# Patient Record
Sex: Male | Born: 1985 | Race: White | Hispanic: No | Marital: Single | Smoking: Former smoker
Health system: Southern US, Community
[De-identification: ages and names within clinical notes are randomized; demographics above are authoritative.]

## PROBLEM LIST (undated history)

## (undated) DIAGNOSIS — F329 Major depressive disorder, single episode, unspecified: Secondary | ICD-10-CM

## (undated) DIAGNOSIS — I499 Cardiac arrhythmia, unspecified: Secondary | ICD-10-CM

## (undated) DIAGNOSIS — Z8659 Personal history of other mental and behavioral disorders: Secondary | ICD-10-CM

## (undated) DIAGNOSIS — F32A Depression, unspecified: Secondary | ICD-10-CM

## (undated) DIAGNOSIS — I480 Paroxysmal atrial fibrillation: Principal | ICD-10-CM

## (undated) DIAGNOSIS — S42001A Fracture of unspecified part of right clavicle, initial encounter for closed fracture: Secondary | ICD-10-CM

## (undated) DIAGNOSIS — F419 Anxiety disorder, unspecified: Secondary | ICD-10-CM

## (undated) HISTORY — DX: Fracture of unspecified part of right clavicle, initial encounter for closed fracture: S42.001A

## (undated) HISTORY — PX: HAND SURGERY: SHX662

## (undated) HISTORY — DX: Paroxysmal atrial fibrillation: I48.0

## (undated) HISTORY — DX: Personal history of other mental and behavioral disorders: Z86.59

---

## 2015-08-04 ENCOUNTER — Other Ambulatory Visit: Payer: No Typology Code available for payment source

## 2015-08-04 DIAGNOSIS — B182 Chronic viral hepatitis C: Secondary | ICD-10-CM

## 2015-08-04 LAB — CBC WITH DIFFERENTIAL/PLATELET
Basophils Absolute: 0.1 10*3/uL (ref 0.0–0.1)
Basophils Relative: 1 % (ref 0–1)
Eosinophils Absolute: 0.2 10*3/uL (ref 0.0–0.7)
Eosinophils Relative: 3 % (ref 0–5)
HEMATOCRIT: 48.2 % (ref 39.0–52.0)
HEMOGLOBIN: 17.1 g/dL — AB (ref 13.0–17.0)
LYMPHS ABS: 2.2 10*3/uL (ref 0.7–4.0)
LYMPHS PCT: 34 % (ref 12–46)
MCH: 31.7 pg (ref 26.0–34.0)
MCHC: 35.5 g/dL (ref 30.0–36.0)
MCV: 89.4 fL (ref 78.0–100.0)
MONOS PCT: 9 % (ref 3–12)
MPV: 9.4 fL (ref 8.6–12.4)
Monocytes Absolute: 0.6 10*3/uL (ref 0.1–1.0)
NEUTROS ABS: 3.5 10*3/uL (ref 1.7–7.7)
NEUTROS PCT: 53 % (ref 43–77)
Platelets: 270 10*3/uL (ref 150–400)
RBC: 5.39 MIL/uL (ref 4.22–5.81)
RDW: 14.2 % (ref 11.5–15.5)
WBC: 6.6 10*3/uL (ref 4.0–10.5)

## 2015-08-04 LAB — HEPATITIS B SURFACE ANTIBODY, QUANTITATIVE: HEPATITIS B-POST: 0 m[IU]/mL

## 2015-08-04 LAB — HEPATITIS A ANTIBODY, TOTAL: Hep A Total Ab: NONREACTIVE

## 2015-08-04 LAB — IRON: IRON: 192 ug/dL — AB (ref 50–180)

## 2015-08-04 LAB — HEPATITIS B CORE ANTIBODY, TOTAL: Hep B Core Total Ab: NONREACTIVE

## 2015-08-04 LAB — HEPATITIS B SURFACE ANTIGEN: HEP B S AG: NEGATIVE

## 2015-08-05 LAB — PROTIME-INR
INR: 0.97 (ref <1.50)
Prothrombin Time: 13 seconds (ref 11.6–15.2)

## 2015-08-05 LAB — ANA: Anti Nuclear Antibody(ANA): NEGATIVE

## 2015-09-07 ENCOUNTER — Encounter: Payer: Self-pay | Admitting: Internal Medicine

## 2015-09-07 ENCOUNTER — Ambulatory Visit (INDEPENDENT_AMBULATORY_CARE_PROVIDER_SITE_OTHER): Payer: No Typology Code available for payment source | Admitting: Internal Medicine

## 2015-09-07 VITALS — BP 108/73 | HR 68 | Temp 98.0°F | Ht 67.0 in | Wt 186.0 lb

## 2015-09-07 DIAGNOSIS — Z23 Encounter for immunization: Secondary | ICD-10-CM

## 2015-09-07 DIAGNOSIS — B182 Chronic viral hepatitis C: Secondary | ICD-10-CM | POA: Insufficient documentation

## 2015-09-07 MED ORDER — SOFOSBUVIR-VELPATASVIR 400-100 MG PO TABS
1.0000 | ORAL_TABLET | Freq: Every day | ORAL | Status: DC
Start: 2015-09-07 — End: 2015-09-27

## 2015-09-07 MED ORDER — SOFOSBUVIR-VELPATASVIR 400-100 MG PO TABS: 1.0000 | ORAL_TABLET | Freq: Every day | ORAL | Status: DC

## 2015-09-07 NOTE — Progress Notes (Signed)
[facility]   CC: consideration for treatment for chronic hepatitis C  HPI:  +Paul Clarke is a 29 y.o. male who presents for initial evaluation and management of chronic hepatitis C.  Patient tested positive this year. Hepatitis C-associated risk factors present are: IV drug abuse (details: about 10 years ago). Patient denies sexual contact with person with liver disease, tattoos. Patient has had other studies performed. Results: hepatitis C RNA by PCR, result: positive. Patient has not had prior treatment for Hepatitis C. Patient does not have a past history of liver disease. Patient does not have a family history of liver disease. Patient does not  have associated signs or symptoms related to liver disease.  Labs reviewed and confirm chronic hepatitis C with a positive viral load.   Records reviewed from PCP.  No acid reflux medicaitons.        Patient does not have documented immunity to Hepatitis A. Patient does not have documented immunity to Hepatitis B.    Review of Systems:   Constitutional: negative for fatigue and malaise Gastrointestinal: negative for diarrhea Musculoskeletal: negative for myalgias and arthralgias All other systems reviewed and are negative      No past medical history on file.  Prior to Admission medications   Medication Sig Start Date End Date Taking? Authorizing Provider  buPROPion (WELLBUTRIN) 75 MG tablet Take 75 mg by mouth daily.   Yes Historical Provider, MD  prazosin (MINIPRESS) 2 MG capsule Take 2 mg by mouth at bedtime.   Yes Historical Provider, MD  QUEtiapine (SEROQUEL) 50 MG tablet Take 50 mg by mouth daily as needed.   Yes Historical Provider, MD  sertraline (ZOLOFT) 50 MG tablet Take 50 mg by mouth daily.   Yes Historical Provider, MD  traZODone (DESYREL) 50 MG tablet Take 50 mg by mouth at bedtime.   Yes Historical Provider, MD  Sofosbuvir-Velpatasvir (EPCLUSA) 400-100 MG TABS Take 1 tablet by mouth daily.  09/07/15   Gardiner Barefootobert W Comer, MD    No Known Allergies  Social History  Substance Use Topics  . Smoking status: Current Every Day Smoker    Types: Cigars    Start date: 10/09/1974  . Smokeless tobacco: Never Used  . Alcohol Use: 0.0 oz/week    0 Standard drinks or equivalent per week     Comment: occasional    FMHx: no liver cancer, no cirrhosis   Objective:  Constitutional: in no apparent distress and alert,  Filed Vitals:   09/07/15 1114  BP: 108/73  Pulse: 68  Temp: 98 F (36.7 C)   Eyes: anicteric Cardiovascular: Cor RRR and No murmurs Respiratory: CTA B; normal respiratory effort Gastrointestinal: Bowel sounds are normal, liver is not enlarged, spleen is not enlarged Musculoskeletal: peripheral pulses normal, no pedal edema, no clubbing or cyanosis Skin: negative for - jaundice, spider hemangioma, telangiectasia, palmar erythema, ecchymosis and atrophy; no porphyria cutanea tarda Lymphatic: no cervical lymphadenopathy   Laboratory Genotype: No results found for: HCVGENOTYPE HCV viral load: No results found for: HCVQUANT Lab Results  Component Value Date   WBC 6.6 08/04/2015   HGB 17.1* 08/04/2015   HCT 48.2 08/04/2015   MCV 89.4 08/04/2015   PLT 270 08/04/2015   No results found for: CREATININE, BUN, NA, K, CL, CO2 No results found for: ALT, AST, GGT, ALKPHOS   Labs and history reviewed and show CHILD-PUGH A  5-6 points: Child class A 7-9 points: Child class B 10-15 points: Child class C  Lab Results  Component Value Date   INR 0.97 08/04/2015     Assessment: New Patient with Chronic Hepatitis C genotype 3, untreated.  I discussed with the patient the lab findings that confirm chronic hepatitis C as well as the natural history and progression of disease including about 30% of people who develop cirrhosis of the liver if left untreated and once cirrhosis is established there is a 2-7% risk per year of liver cancer and liver failure.  I discussed the  importance of treatment and benefits in reducing the risk, even if significant liver fibrosis exists.   Plan: 1) Patient counseled extensively on limiting acetaminophen to no more than 2 grams daily, avoidance of alcohol. 2) Transmission discussed with patient including sexual transmission, sharing razors and toothbrush.   3) Will need referral to gastroenterology if concern for cirrhosis 4) Will need referral for substance abuse counseling: No.; Further work up to include urine drug screen  No. 5) Will prescribe Epclusa for 12 weeks 6) Hepatitis A vaccine Yes.   7) Hepatitis B vaccine Yes.   8) Pneumovax vaccine if concern for cirrhosis 9) Further work up to include liver staging with elastography 10) will follow up after elastography

## 2015-09-07 NOTE — Patient Instructions (Signed)
Date 09/07/2015  Dear [name], As discussed in the ID Clinic, your hepatitis C therapy will include the following medications:          Epclusa (sofosbuvir 400 mg/velpatasvir 100 mg) tablet:           Take 1 tablet by mouth once daily   Please note that ALL MEDICATIONS WILL START ON THE SAME DATE for a total of 12 weeks. ---------------------------------------------------------------- Your HCV Treatment Start Date: TBA   Your HCV genotype:  3    Liver Fibrosis: TBD    ---------------------------------------------------------------- YOUR PHARMACY CONTACT:   [facility] [address] Phone: [phone] Hours: Monday to Friday 7:30 am to 6:00 pm   Please always contact your pharmacy at least 3-4 business days before you run out of medications to ensure your next month's medication is ready or 1 week prior to running out if you receive it by mail.  Remember, each prescription is for 28 days. ---------------------------------------------------------------- GENERAL NOTES REGARDING YOUR HEPATITIS C MEDICATION:  SOFOSBUVIR/VELPATASVIR (Epclusa): - can be taken with our without food  - Acid reducing agents such as H2 blockers (ie. Pepcid (famotidine), Zantac (ranitidine), Tagamet (cimetidine), Axid (nizatidine) and proton pump inhibitors (ie. Prilosec (omeprazole), Protonix (pantoprazole), Nexium (esomeprazole), or Aciphex (rabeprazole)) can decrease effectiveness of Epclusa. Do not take until you have discussed with a health care provider.    -Antacids that contain magnesium and/or aluminum hydroxide (ie. Milk of Magensia, Rolaids, Gaviscon, Maalox, Mylanta, an dArthritis Pain Formula)can reduce absorption of Epclusa, so take them at least 4 hours after Epclusa.  -Calcium carbonate (calcium supplements or antacids such as Tums, Caltrate, Os-Cal)needs to be taken at least 4 hours hours after Epclusa.  -St. John's  wort or any products that contain St. John's wort like some herbal supplements  Please inform the office prior to starting any of these medications.  - The common side effects associated with Epclusa include:      1. Fatigue      2. Headache      3. Nausea      4. Diarrhea      5. Insomnia  Please note that this only lists the most common side effects and is NOT a comprehensive list of the potential side effects of these medications. For more information, please review the drug information sheets that come with your medication package from the pharmacy.  ---------------------------------------------------------------- GENERAL HELPFUL HINTS ON HCV THERAPY: 1. Stay well-hydrated. 2. Notify the ID Clinic of any changes in your other over-the-counter/herbal or prescription medications. 3. If you miss a dose of your medication, take the missed dose as soon as you remember. Return to your regular time/dose schedule the next day.  4.  Do not stop taking your medications without first talking with your healthcare provider. 5.  You may take Tylenol (acetaminophen), as long as the dose is less than 2000 mg (OR no more than 4 tablets of the Tylenol Extra Strengths 500mg  tablet) in 24 hours. 6.  You will see our pharmacist-specialist within the first 2 weeks of starting your medication. 7.  You will need to obtain routine labs around week 4 and12 weeks after starting and then 3 to 6 months after finishing Epclusa.    Staci RighterOMER, Kunta, MD  [facility] [address] [place], [place]  [id number] [phone]

## 2015-09-07 NOTE — Addendum Note (Signed)
Addended by: Gardiner BarefootOMER, Thanos W on: 09/07/2015 02:46 PM   Modules accepted: Orders

## 2015-09-13 ENCOUNTER — Encounter: Payer: Self-pay | Admitting: Pharmacy Technician

## 2015-09-28 ENCOUNTER — Ambulatory Visit
Payer: No Typology Code available for payment source | Admitting: Pharmacist Clinician (PhC)/ Clinical Pharmacy Specialist

## 2015-09-28 DIAGNOSIS — B182 Chronic viral hepatitis C: Secondary | ICD-10-CM

## 2015-09-28 NOTE — Progress Notes (Signed)
Patient ID: Paul Clarke Toenjes, male   DOB: 12/14/1985, 29 y.o.   MRN: [id number] HPI: Paul Clarke Tirone is a 29 y.o. male who is here for his 2 weeks f/u of hep c.  No results found for: HCVGENOTYPE, HEPCGENOTYPE  Allergies: No Known Allergies  Vitals:    Past Medical History: No past medical history on file.  Social History: Social History   Social History  . Marital Status: Unknown    Spouse Name: N/A  . Number of Children: N/A  . Years of Education: N/A   Social History Main Topics  . Smoking status: Current Every Day Smoker    Types: Cigars    Start date: 10/09/1974  . Smokeless tobacco: Never Used  . Alcohol Use: 0.0 oz/week    0 Standard drinks or equivalent per week     Comment: occasional  . Drug Use: Yes    Special: Marijuana  . Sexual Activity: Not on file   Other Topics Concern  . Not on file   Social History Narrative  . No narrative on file    Labs: HEPATITIS B SURFACE AG (no units)  Date Value  08/04/2015 NEGATIVE    No results found for: HCVGENOTYPE, HEPCGENOTYPE  No flowsheet data found.  INR (no units)  Date Value  08/04/2015 0.97    CrCl: CrCl cannot be calculated (Unknown ideal weight.).  Fibrosis Score: ARFI 12/21   Child-Pugh Score: Class A  Previous Treatment Regimen: Naive  Assessment: Molly MaduroRobert started his Epclusa on 12/5 for the genotype 3 hep C. He only had some mild side effects from it. He has not missed any doses. 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 He is scheduled to get his elastography tomorrow. Due to the holiday schedule, we are going to delay lab until the appt with Dr. Luciana Axeomer on 10/14/15.   Recommendations:  Cont Epclusa 1 PO qday Lab at the next appt with Dr. Zettie Cooleyomer  Pham, Appleton[place]h Quang, [place].D., BCPS, AAHIVP Clinical Infectious Disease Pharmacist [facility] 09/28/2015, 4:28 PM

## 2015-09-29 ENCOUNTER — Ambulatory Visit (HOSPITAL_COMMUNITY)
Admission: RE | Admit: 2015-09-29 | Discharge: 2015-09-29 | Disposition: A | Discharge status: Home / Self Care | Payer: Self-pay | Source: Ambulatory Visit | Attending: Internal Medicine | Admitting: Internal Medicine

## 2015-09-29 DIAGNOSIS — B182 Chronic viral hepatitis C: Secondary | ICD-10-CM | POA: Insufficient documentation

## 2015-10-14 ENCOUNTER — Encounter: Payer: Self-pay | Admitting: Internal Medicine

## 2015-10-14 ENCOUNTER — Ambulatory Visit (INDEPENDENT_AMBULATORY_CARE_PROVIDER_SITE_OTHER): Payer: No Typology Code available for payment source | Admitting: Internal Medicine

## 2015-10-14 VITALS — BP 141/87 | HR 94 | Temp 98.1°F | Wt 183.0 lb

## 2015-10-14 DIAGNOSIS — B182 Chronic viral hepatitis C: Secondary | ICD-10-CM

## 2015-10-14 DIAGNOSIS — K7400 Hepatic fibrosis, unspecified: Secondary | ICD-10-CM | POA: Insufficient documentation

## 2015-10-14 DIAGNOSIS — Z23 Encounter for immunization: Secondary | ICD-10-CM

## 2015-10-14 NOTE — Progress Notes (Signed)
   Subjective:    Patient ID: Paul Clarke, male    DOB: 03/30/1986, 30 y.o.   MRN: [id number]  HPI Here for follow up of HCV.    Genotype 3, viral load from outside PCP, elastography with F2/3.  Started Epclusa 4 weeks ago and tolerating well.  No weight loss, no rash. Some fatigue more than usual.     Review of Systems  Constitutional: Positive for fatigue.  Gastrointestinal: Negative for diarrhea.  Neurological: Negative for dizziness and headaches.       Objective:   Physical Exam  Constitutional: He appears well-developed and well-nourished. No distress.  Eyes: No scleral icterus.  Cardiovascular: Normal rate, regular rhythm and normal heart sounds.   No murmur heard. Pulmonary/Chest: Effort normal and breath sounds normal. No respiratory distress.  Skin: No rash noted.          Assessment & Plan:

## 2015-10-14 NOTE — Assessment & Plan Note (Signed)
Discussed results, limit alcohol.  Repeat in 1 year.

## 2015-10-14 NOTE — Assessment & Plan Note (Signed)
Will continue with Epclusa, labs today and rtc 2 months for end of treatment unless concerns.

## 2015-10-19 LAB — HEPATITIS C RNA QUANTITATIVE: HCV Quantitative: NOT DETECTED IU/mL (ref <15)

## 2015-11-04 ENCOUNTER — Telehealth: Payer: Self-pay | Admitting: Pharmacy Technician

## 2015-11-12 ENCOUNTER — Encounter: Payer: Self-pay | Admitting: Pharmacy Technician

## 2015-11-15 NOTE — Telephone Encounter (Signed)
Left message for the patient to call the office asap very important his medication is here for him to pick up.

## 2015-12-16 ENCOUNTER — Ambulatory Visit: Payer: No Typology Code available for payment source | Admitting: Internal Medicine

## 2015-12-17 ENCOUNTER — Telehealth: Payer: Self-pay | Admitting: *Deleted

## 2015-12-17 NOTE — Telephone Encounter (Signed)
Patient came by clinic to pick up his 3rd month of medication. Was a little leary of giving him the medication as we have been trying to get him since end of January. He advised he got his first month and 2 weeks later got his 2nd shipment. He has only missed 2 days as of today and no other. He works out of town and was able to get here today. Relayed the information to Dr Luciana Axeomer and he advise based on the information to give the patient his last month, schedule him for labs in 6 weeks and to see him in 8 weeks. The patient was given medication and appts scheduled and he advised understands follow up and importance of finishing medications. Advised will let pharmacy know about interaction.

## 2016-01-31 ENCOUNTER — Other Ambulatory Visit: Payer: No Typology Code available for payment source

## 2016-02-14 ENCOUNTER — Ambulatory Visit: Payer: No Typology Code available for payment source | Admitting: Internal Medicine

## 2016-07-09 DIAGNOSIS — S42001A Fracture of unspecified part of right clavicle, initial encounter for closed fracture: Secondary | ICD-10-CM

## 2016-07-09 DIAGNOSIS — I480 Paroxysmal atrial fibrillation: Secondary | ICD-10-CM

## 2016-07-09 HISTORY — DX: Fracture of unspecified part of right clavicle, initial encounter for closed fracture: S42.001A

## 2016-07-09 HISTORY — DX: Paroxysmal atrial fibrillation: I48.0

## 2016-07-17 ENCOUNTER — Emergency Department (HOSPITAL_COMMUNITY): Payer: Worker's Compensation

## 2016-07-17 ENCOUNTER — Other Ambulatory Visit: Payer: Self-pay

## 2016-07-17 ENCOUNTER — Inpatient Hospital Stay (HOSPITAL_COMMUNITY)
Admission: EM | Admit: 2016-07-17 | Discharge: 2016-07-19 | DRG: 923 | Disposition: A | Discharge status: Home / Self Care | Payer: Worker's Compensation | Attending: Orthopedic Surgery | Admitting: Orthopedic Surgery

## 2016-07-17 ENCOUNTER — Encounter (HOSPITAL_COMMUNITY): Payer: Self-pay | Admitting: *Deleted

## 2016-07-17 DIAGNOSIS — T23031A Burn of unspecified degree of multiple right fingers (nail), not including thumb, initial encounter: Secondary | ICD-10-CM | POA: Diagnosis present

## 2016-07-17 DIAGNOSIS — S060X1A Concussion with loss of consciousness of 30 minutes or less, initial encounter: Secondary | ICD-10-CM | POA: Diagnosis present

## 2016-07-17 DIAGNOSIS — Z823 Family history of stroke: Secondary | ICD-10-CM | POA: Diagnosis not present

## 2016-07-17 DIAGNOSIS — W11XXXA Fall on and from ladder, initial encounter: Secondary | ICD-10-CM | POA: Diagnosis present

## 2016-07-17 DIAGNOSIS — I4891 Unspecified atrial fibrillation: Secondary | ICD-10-CM | POA: Diagnosis present

## 2016-07-17 DIAGNOSIS — S060X9A Concussion with loss of consciousness of unspecified duration, initial encounter: Secondary | ICD-10-CM | POA: Diagnosis present

## 2016-07-17 DIAGNOSIS — Z23 Encounter for immunization: Secondary | ICD-10-CM

## 2016-07-17 DIAGNOSIS — W868XXA Exposure to other electric current, initial encounter: Secondary | ICD-10-CM | POA: Diagnosis present

## 2016-07-17 DIAGNOSIS — R413 Other amnesia: Secondary | ICD-10-CM | POA: Diagnosis present

## 2016-07-17 DIAGNOSIS — S2231XA Fracture of one rib, right side, initial encounter for closed fracture: Secondary | ICD-10-CM | POA: Diagnosis present

## 2016-07-17 DIAGNOSIS — S22009A Unspecified fracture of unspecified thoracic vertebra, initial encounter for closed fracture: Secondary | ICD-10-CM

## 2016-07-17 DIAGNOSIS — Y9222 Religious institution as the place of occurrence of the external cause: Secondary | ICD-10-CM | POA: Diagnosis not present

## 2016-07-17 DIAGNOSIS — R55 Syncope and collapse: Secondary | ICD-10-CM | POA: Diagnosis not present

## 2016-07-17 DIAGNOSIS — Y939 Activity, unspecified: Secondary | ICD-10-CM | POA: Diagnosis not present

## 2016-07-17 DIAGNOSIS — Z885 Allergy status to narcotic agent status: Secondary | ICD-10-CM | POA: Diagnosis not present

## 2016-07-17 DIAGNOSIS — S42001A Fracture of unspecified part of right clavicle, initial encounter for closed fracture: Secondary | ICD-10-CM | POA: Diagnosis present

## 2016-07-17 DIAGNOSIS — S22089A Unspecified fracture of T11-T12 vertebra, initial encounter for closed fracture: Secondary | ICD-10-CM | POA: Diagnosis present

## 2016-07-17 DIAGNOSIS — T754XXA Electrocution, initial encounter: Secondary | ICD-10-CM | POA: Diagnosis present

## 2016-07-17 DIAGNOSIS — I480 Paroxysmal atrial fibrillation: Secondary | ICD-10-CM | POA: Diagnosis not present

## 2016-07-17 DIAGNOSIS — S42034A Nondisplaced fracture of lateral end of right clavicle, initial encounter for closed fracture: Secondary | ICD-10-CM | POA: Diagnosis present

## 2016-07-17 DIAGNOSIS — Y990 Civilian activity done for income or pay: Secondary | ICD-10-CM | POA: Diagnosis not present

## 2016-07-17 DIAGNOSIS — S060XAA Concussion with loss of consciousness status unknown, initial encounter: Secondary | ICD-10-CM | POA: Diagnosis present

## 2016-07-17 DIAGNOSIS — Z8249 Family history of ischemic heart disease and other diseases of the circulatory system: Secondary | ICD-10-CM

## 2016-07-17 DIAGNOSIS — F17290 Nicotine dependence, other tobacco product, uncomplicated: Secondary | ICD-10-CM | POA: Diagnosis present

## 2016-07-17 LAB — CBC WITH DIFFERENTIAL/PLATELET
Basophils Absolute: 0.1 10*3/uL (ref 0.0–0.1)
Basophils Relative: 1 %
Eosinophils Absolute: 0.2 10*3/uL (ref 0.0–0.7)
Eosinophils Relative: 3 %
HEMATOCRIT: 52 % (ref 39.0–52.0)
HEMOGLOBIN: 18.2 g/dL — AB (ref 13.0–17.0)
LYMPHS ABS: 3 10*3/uL (ref 0.7–4.0)
LYMPHS PCT: 39 %
MCH: 33 pg (ref 26.0–34.0)
MCHC: 35 g/dL (ref 30.0–36.0)
MCV: 94.4 fL (ref 78.0–100.0)
MONO ABS: 0.7 10*3/uL (ref 0.1–1.0)
MONOS PCT: 9 %
NEUTROS ABS: 3.8 10*3/uL (ref 1.7–7.7)
NEUTROS PCT: 48 %
Platelets: 234 10*3/uL (ref 150–400)
RBC: 5.51 MIL/uL (ref 4.22–5.81)
RDW: 14.1 % (ref 11.5–15.5)
WBC: 7.7 10*3/uL (ref 4.0–10.5)

## 2016-07-17 LAB — URINALYSIS, ROUTINE W REFLEX MICROSCOPIC
BILIRUBIN URINE: NEGATIVE
Glucose, UA: NEGATIVE mg/dL
Hgb urine dipstick: NEGATIVE
Ketones, ur: NEGATIVE mg/dL
LEUKOCYTES UA: NEGATIVE
Nitrite: NEGATIVE
Protein, ur: NEGATIVE mg/dL
SPECIFIC GRAVITY, URINE: 1.013 (ref 1.005–1.030)
pH: 7.5 (ref 5.0–8.0)

## 2016-07-17 LAB — COMPREHENSIVE METABOLIC PANEL
ALK PHOS: 59 U/L (ref 38–126)
ALT: 22 U/L (ref 17–63)
ANION GAP: 10 (ref 5–15)
AST: 30 U/L (ref 15–41)
Albumin: 3.9 g/dL (ref 3.5–5.0)
BILIRUBIN TOTAL: 0.7 mg/dL (ref 0.3–1.2)
BUN: 7 mg/dL (ref 6–20)
CALCIUM: 8.9 mg/dL (ref 8.9–10.3)
CO2: 23 mmol/L (ref 22–32)
Chloride: 104 mmol/L (ref 101–111)
Creatinine, Ser: 0.9 mg/dL (ref 0.61–1.24)
GFR calc Af Amer: >60 mL/min (ref >60)
GLUCOSE: 99 mg/dL (ref 65–99)
POTASSIUM: 3.8 mmol/L (ref 3.5–5.1)
Sodium: 137 mmol/L (ref 135–145)
TOTAL PROTEIN: 6.5 g/dL (ref 6.5–8.1)

## 2016-07-17 LAB — I-STAT TROPONIN, ED
TROPONIN I, POC: 0 ng/mL (ref 0.00–0.08)
Troponin i, poc: 0 ng/mL (ref 0.00–0.08)

## 2016-07-17 LAB — I-STAT CG4 LACTIC ACID, ED
LACTIC ACID, VENOUS: 1.75 mmol/L (ref 0.5–1.9)
LACTIC ACID, VENOUS: 1.86 mmol/L (ref 0.5–1.9)

## 2016-07-17 LAB — CK: Total CK: 71 U/L (ref 49–397)

## 2016-07-17 MED ORDER — HYDROMORPHONE HCL 1 MG/ML IJ SOLN
1.0000 mg | Freq: Once | INTRAMUSCULAR | Status: AC
  Administered 2016-07-17: 1 mg via INTRAVENOUS
  Filled 2016-07-17: qty 1

## 2016-07-17 MED ORDER — POTASSIUM CHLORIDE IN NACL 20-0.45 MEQ/L-% IV SOLN
INTRAVENOUS | Status: DC
  Administered 2016-07-17: 22:00:00 via INTRAVENOUS
  Filled 2016-07-17: qty 1000

## 2016-07-17 MED ORDER — DOCUSATE SODIUM 100 MG PO CAPS
100.0000 mg | ORAL_CAPSULE | Freq: Two times a day (BID) | ORAL | Status: DC
  Administered 2016-07-17 – 2016-07-19 (×4): 100 mg via ORAL
  Filled 2016-07-17 (×4): qty 1

## 2016-07-17 MED ORDER — ONDANSETRON HCL 4 MG PO TABS: 4.0000 mg | ORAL_TABLET | Freq: Four times a day (QID) | ORAL | Status: DC | PRN

## 2016-07-17 MED ORDER — IOPAMIDOL (ISOVUE-300) INJECTION 61%
INTRAVENOUS | Status: AC
  Administered 2016-07-17: 100 mL
  Filled 2016-07-17: qty 100

## 2016-07-17 MED ORDER — SODIUM CHLORIDE 0.9 % IV BOLUS (SEPSIS)
1000.0000 mL | Freq: Once | INTRAVENOUS | Status: AC
  Administered 2016-07-17: 1000 mL via INTRAVENOUS

## 2016-07-17 MED ORDER — ENOXAPARIN SODIUM 40 MG/0.4ML ~~LOC~~ SOLN
40.0000 mg | SUBCUTANEOUS | Status: DC
  Administered 2016-07-18: 40 mg via SUBCUTANEOUS
  Filled 2016-07-17 (×2): qty 0.4

## 2016-07-17 MED ORDER — DILTIAZEM LOAD VIA INFUSION
20.0000 mg | Freq: Once | INTRAVENOUS | Status: AC
  Administered 2016-07-17: 20 mg via INTRAVENOUS
  Filled 2016-07-17: qty 20

## 2016-07-17 MED ORDER — HYDROMORPHONE HCL 1 MG/ML IJ SOLN
0.5000 mg | INTRAMUSCULAR | Status: DC | PRN
  Administered 2016-07-17 – 2016-07-18 (×6): 1 mg via INTRAVENOUS
  Filled 2016-07-17 (×6): qty 1

## 2016-07-17 MED ORDER — PANTOPRAZOLE SODIUM 40 MG PO TBEC
40.0000 mg | DELAYED_RELEASE_TABLET | Freq: Every day | ORAL | Status: DC
  Administered 2016-07-17: 40 mg via ORAL
  Filled 2016-07-17: qty 1

## 2016-07-17 MED ORDER — ONDANSETRON HCL 4 MG/2ML IJ SOLN
4.0000 mg | Freq: Four times a day (QID) | INTRAMUSCULAR | Status: DC | PRN
  Administered 2016-07-17 – 2016-07-18 (×2): 4 mg via INTRAVENOUS
  Filled 2016-07-17 (×2): qty 2

## 2016-07-17 MED ORDER — DILTIAZEM HCL-DEXTROSE 100-5 MG/100ML-% IV SOLN (PREMIX)
5.0000 mg/h | INTRAVENOUS | Status: DC
  Administered 2016-07-17: 5 mg/h via INTRAVENOUS
  Administered 2016-07-18: 10 mg/h via INTRAVENOUS
  Filled 2016-07-17 (×2): qty 100

## 2016-07-17 NOTE — ED Notes (Signed)
Patient states he was working on some lights in [place] was up on a 848ft. Ladder was electrocuted with 110 volt and fell backward off the ladder landing on his right side. Co- workers state he was [employer]. 30 sec. Up arrival a/o c/o right clavicle pain. Ice pack applied.

## 2016-07-17 NOTE — ED Notes (Signed)
Returned from xray

## 2016-07-17 NOTE — ED Provider Notes (Signed)
[facility]-EMERGENCY DEPT Provider Note   CSN: [id number] Arrival date & time: 07/17/16  1110     History   Chief Complaint No chief complaint on file.   HPI Paul Clarke is a 30 y.o. male.  The history is provided by the patient and the EMS personnel.  Trauma Mechanism of injury: fall 8 feet from ladder after electrical shock from wires and fall Injury location: shoulder/arm Injury location detail: R shoulder Incident location: at work Time since incident: 30 minutes Arrived directly from scene: yes   Fall:      Fall occurred: from a ladder      Height of fall: 8 feet      Impact surface: concrete      Point of impact: right shoulder and right sided chest.  EMS/PTA data:      Bystander interventions: none      Blood loss: none      Responsiveness: alert      Oriented to: person, place, situation and time      Loss of consciousness: yes (x 20 seconds per witness)      Amnesic to event: yes      Airway interventions: none      Breathing interventions: none      Medications administered: fentanyl (150 mcg PTA)  Current symptoms:      Associated symptoms:            Reports loss of consciousness (x 20 seconds per witness).            Denies abdominal pain, back pain, chest pain, headache, nausea and neck pain.    History reviewed. No pertinent past medical history.  There are no active problems to display for this patient.   History reviewed. No pertinent surgical history.     Home Medications    Prior to Admission medications   Not on File    Family History History reviewed. No pertinent family history.  Social History Social History  Substance Use Topics  . Smoking status: Current Some Day Smoker  . Smokeless tobacco: Never Used  . Alcohol use Yes     Allergies   Morphine and related   Review of Systems Review of Systems  HENT: Negative for facial swelling.   Respiratory: Negative for shortness of breath.   Cardiovascular: Negative for  chest pain.  Gastrointestinal: Negative for abdominal pain and nausea.  Genitourinary: Negative for flank pain.  Musculoskeletal: Negative for back pain and neck pain.  Skin: Negative for wound.  Neurological: Positive for loss of consciousness (x 20 seconds per witness). Negative for headaches.  Hematological: Does not bruise/bleed easily.  Psychiatric/Behavioral: Negative for confusion.     Physical Exam Updated Vital Signs BP 144/95   Pulse 78   Temp 98.2 F (36.8 C) (Oral)   Resp 14   SpO2 91%   Physical Exam  Constitutional: He is oriented to person, place, and time. He appears well-developed and well-nourished. No distress.  Pleasant, cooperative, non-toxic appearing  HENT:  Head: Normocephalic and atraumatic.  Mouth/Throat: Oropharynx is clear and moist.  No mouth/dental injuries  Eyes: Conjunctivae and EOM are normal. Pupils are equal, round, and reactive to light. No scleral icterus.  Neck: Normal range of motion. Neck supple. No tracheal deviation present.  No c-spine TTP  Cardiovascular: Intact distal pulses.   2+ radial and Dp's b/l. Irregularly irregular rhythm with rate 140's  Pulmonary/Chest: Effort normal and breath sounds normal. No respiratory distress. He exhibits no tenderness.  Abdominal:  Soft. He exhibits no distension. There is no tenderness.  No abdominal or flank ecchymoses or hematomas  Musculoskeletal: He exhibits tenderness. He exhibits no edema or deformity.  Right shoulder nonfocal TTP. Full active ROM of right elbow and wrist, no other arm tenderness. Intact sensation and strength of hand. No spinal TTP. No other extremity TTP. Abrasion over right elbow  Neurological: He is alert and oriented to person, place, and time. No cranial nerve deficit. He exhibits normal muscle tone. Coordination normal.  Intact strength and sensation throughout  Skin: Skin is warm and dry. Capillary refill takes less than 2 seconds. No rash noted. He is not diaphoretic.  No pallor.  Psychiatric: He has a normal mood and affect.  Nursing note and vitals reviewed.    ED Treatments / Results  Labs (all labs ordered are listed, but only abnormal results are displayed) Labs Reviewed  CBC WITH DIFFERENTIAL/PLATELET - Abnormal; Notable for the following:       Result Value   Hemoglobin 18.2 (*)    All other components within normal limits  COMPREHENSIVE METABOLIC PANEL  URINALYSIS, ROUTINE W REFLEX MICROSCOPIC (NOT AT [facility])  I-STAT CG4 LACTIC ACID, ED  Rosezena Sensor, ED  I-STAT CG4 LACTIC ACID, ED    EKG  EKG Interpretation None       Radiology Dg Thoracic Spine 2 View  Result Date: 07/17/2016 CLINICAL DATA:  Fall with back pain.  Initial encounter. EXAM: THORACIC SPINE 2 VIEWS COMPARISON:  None. FINDINGS: Mild height loss along the T11 left-sided vertebral body with mild cortical irregularity also seen in the lateral projection. No subluxation. No degenerative changes. IMPRESSION: Probable T11 body fracture with mild height loss. Electronically Signed   By: Marnee Spring M.D.   On: 07/17/2016 13:04   Dg Lumbar Spine Complete  Result Date: 07/17/2016 CLINICAL DATA:  Level 2 trauma. Fall from ladder. Back pain. Initial encounter. EXAM: LUMBAR SPINE - COMPLETE 4+ VIEW COMPARISON:  None. FINDINGS: Nondisplaced right twelfth rib fracture. No evidence of lumbar spine fracture or subluxation. IMPRESSION: Nondisplaced right twelfth rib fracture. Negative lumbar spine. Electronically Signed   By: Marnee Spring M.D.   On: 07/17/2016 13:02   Dg Shoulder Right  Result Date: 07/17/2016 CLINICAL DATA:  30 year old male post trauma (electrocuted and thrown from ladder). Initial encounter. EXAM: RIGHT SHOULDER - 2+ VIEW COMPARISON:  None. FINDINGS: No fracture or dislocation. The minimal indentation of the lateral right humeral head is felt to represent a normal finding rather than result of fracture or impaction injury. No abnormality seen in this region on  accompanying right humerus films. IMPRESSION: No fracture or dislocation.  Please see above. Electronically Signed   By: Lacy Duverney M.D.   On: 07/17/2016 13:05   Ct Head Wo Contrast  Result Date: 07/17/2016 CLINICAL DATA:  Electrocution with fall from ladder EXAM: CT HEAD WITHOUT CONTRAST CT CERVICAL SPINE WITHOUT CONTRAST TECHNIQUE: Multidetector CT imaging of the head and cervical spine was performed following the standard protocol without intravenous contrast. Multiplanar CT image reconstructions of the cervical spine were also generated. COMPARISON:  None. FINDINGS: CT HEAD FINDINGS Brain: The ventricles are normal in size and configuration. There is no intracranial mass hemorrhage, extra-axial fluid collection, or midline shift. Gray-white compartments are normal. There is no evident acute infarct. Vascular: No hyperdense vessel. Middle cerebral arteries show equivalent attenuation bilaterally. There are no foci of arterial vascular calcification. Skull: Bony calvarium appears intact. Sinuses/Orbits: There is mucosal thickening in ethmoid air cells bilaterally anteriorly,  full left normal height. There is mild mucosal thickening in the posterior right maxillary antrum. Other paranasal sinuses clear. Orbits appear symmetric bilaterally. Other: Mastoid air cells are clear. CT CERVICAL SPINE FINDINGS Alignment: There is no spondylolisthesis. Skull base and vertebrae: Skull base and craniocervical junction regions appear normal. There is no evident fracture. No blastic or lytic bone lesions. Soft tissues and spinal canal: Prevertebral soft tissues and predental space regions are normal. No spinal stenosis. No paraspinous lesions evident. Disc levels: There is slight disc space narrowing at C6-7. Other disc spaces appear normal. No nerve root edema or effacement. No disc extrusion or stenosis. Upper chest: No abnormality noted. Other: None IMPRESSION: CT head: No intracranial mass, hemorrhage, or extra-axial  fluid collection. Gray-white compartments appear normal. No fractures evident. There are areas of paranasal sinus disease. CT cervical spine: No fracture or spondylolisthesis. Slight disc space narrowing C6-7. No prevertebral effaced. Disc extrusion or stenosis. Electronically Signed   By: Bretta BangWilliam  Woodruff III M.D.   On: 07/17/2016 12:35   Ct Chest W Contrast  Result Date: 07/17/2016 CLINICAL DATA:  Level 2 trauma. Electrocution with fall. Suggestion of the T11 vertebral body fracture on thoracic spine radiographs from earlier today. EXAM: CT CHEST WITH CONTRAST TECHNIQUE: Multidetector CT imaging of the chest was performed during intravenous contrast administration. CONTRAST:  100mL ISOVUE-300 IOPAMIDOL (ISOVUE-300) INJECTION 61% COMPARISON:  07/17/2016 thoracic spine radiographs. FINDINGS: Cardiovascular: Normal heart size. No significant pericardial fluid/thickening. Great vessels are normal in course and caliber. No evidence of acute thoracic aortic injury. No central pulmonary emboli. Mediastinum/Nodes: No pneumomediastinum. No mediastinal hematoma. No discrete thyroid nodules. Unremarkable esophagus. No axillary, mediastinal or hilar lymphadenopathy. Lungs/Pleura: No pneumothorax. No pleural effusion. Minimal paraseptal emphysema at the lung apices. No acute consolidative airspace disease, lung masses or significant pulmonary nodules. No pneumatoceles. Upper abdomen: Unremarkable. Musculoskeletal: No aggressive appearing focal osseous lesions. There is a comminuted medial right clavicle fracture, which appears to involve the anterior superior margin of the right sternoclavicular joint, with mild overriding of the fracture fragments and 1 cm anterior displacement of the dominant lateral fracture fragment. There is associated intramuscular hematoma in the lower right sternocleidomastoid. There is a mild compression fracture involving the anterior superior T11 vertebral body with minimal 10-20% loss of  vertebral height anteriorly. Remaining thoracic vertebral body heights are preserved. No thoracic spine subluxation. No bony retropulsion of fragments at the T11 level. No additional fracture. IMPRESSION: 1. Comminuted intra-articular medial right clavicle fracture, with displacement and overriding as described. 2. Mild anterior superior T11 vertebral body compression fracture. 3. No pneumothorax.  No pleural effusions. 4. Minimal paraseptal emphysema at the lung apices. No acute cardiopulmonary disease. Electronically Signed   By: Delbert PhenixJason A Poff M.D.   On: 07/17/2016 15:07   Ct Cervical Spine Wo Contrast  Result Date: 07/17/2016 CLINICAL DATA:  Electrocution with fall from ladder EXAM: CT HEAD WITHOUT CONTRAST CT CERVICAL SPINE WITHOUT CONTRAST TECHNIQUE: Multidetector CT imaging of the head and cervical spine was performed following the standard protocol without intravenous contrast. Multiplanar CT image reconstructions of the cervical spine were also generated. COMPARISON:  None. FINDINGS: CT HEAD FINDINGS Brain: The ventricles are normal in size and configuration. There is no intracranial mass hemorrhage, extra-axial fluid collection, or midline shift. Gray-white compartments are normal. There is no evident acute infarct. Vascular: No hyperdense vessel. Middle cerebral arteries show equivalent attenuation bilaterally. There are no foci of arterial vascular calcification. Skull: Bony calvarium appears intact. Sinuses/Orbits: There is mucosal thickening in ethmoid  air cells bilaterally anteriorly, full left normal height. There is mild mucosal thickening in the posterior right maxillary antrum. Other paranasal sinuses clear. Orbits appear symmetric bilaterally. Other: Mastoid air cells are clear. CT CERVICAL SPINE FINDINGS Alignment: There is no spondylolisthesis. Skull base and vertebrae: Skull base and craniocervical junction regions appear normal. There is no evident fracture. No blastic or lytic bone  lesions. Soft tissues and spinal canal: Prevertebral soft tissues and predental space regions are normal. No spinal stenosis. No paraspinous lesions evident. Disc levels: There is slight disc space narrowing at C6-7. Other disc spaces appear normal. No nerve root edema or effacement. No disc extrusion or stenosis. Upper chest: No abnormality noted. Other: None IMPRESSION: CT head: No intracranial mass, hemorrhage, or extra-axial fluid collection. Gray-white compartments appear normal. No fractures evident. There are areas of paranasal sinus disease. CT cervical spine: No fracture or spondylolisthesis. Slight disc space narrowing C6-7. No prevertebral effaced. Disc extrusion or stenosis. Electronically Signed   By: Bretta Bang III M.D.   On: 07/17/2016 12:35   Dg Chest Portable 1 View  Result Date: 07/17/2016 CLINICAL DATA:  Trauma, right upper chest pain. EXAM: PORTABLE CHEST 1 VIEW COMPARISON:  None. FINDINGS: Heart and mediastinal contours are within normal limits. No focal opacities or effusions. No acute bony abnormality. No pneumothorax. No visible rib fracture. IMPRESSION: No active disease. Electronically Signed   By: Charlett Nose M.D.   On: 07/17/2016 11:28   Dg Humerus Right  Result Date: 07/17/2016 CLINICAL DATA:  Pain with movement of right arm after electrocution and fall from ladder. EXAM: RIGHT HUMERUS - 2+ VIEW COMPARISON:  None. FINDINGS: There is no evidence of fracture or other focal bone lesions. Soft tissues are unremarkable. IMPRESSION: Negative. Electronically Signed   By: Kennith Center M.D.   On: 07/17/2016 13:10    Procedures Procedures (including critical care time)  Medications Ordered in ED Medications  diltiazem (CARDIZEM) 1 mg/mL load via infusion 20 mg (20 mg Intravenous Bolus from Bag 07/17/16 1633)    And  diltiazem (CARDIZEM) 100 mg in dextrose 5% (1 mg/mL) infusion (5 mg/hr Intravenous New Bag/Given 07/17/16 1633)  sodium chloride 0.9 % bolus 1,000 mL  (1,000 mLs Intravenous New Bag/Given 07/17/16 1632)  HYDROmorphone (DILAUDID) injection 1 mg (1 mg Intravenous Given 07/17/16 1234)  iopamidol (ISOVUE-300) 61 % injection (100 mLs  Contrast Given 07/17/16 1436)  HYDROmorphone (DILAUDID) injection 1 mg (1 mg Intravenous Given 07/17/16 1536)     Initial Impression / Assessment and Plan / ED Course  I have reviewed the triage vital signs and the nursing notes.  Pertinent labs & imaging results that were available during my care of the patient were reviewed by me and considered in my medical decision making (see chart for details).  Clinical Course   Paul Clarke is a 30 y.o. male without significant PMHx who presents to ED via EMS in atrial fibrillation with RVR after 8 foot fall from ladder that occurred 2/2 electrical shock at work that resulted in syncope. By report from pt boss who witnessed the event, pt had about 20 second LOC before return to full consciousness, no AMS. Pt denies chest pain or dyspnea. Noted to land on right side with associated abrasions over right lateral chest wall, no chest wall TTP. Pain with limited active ROM of right shoulder, no other notable MSK injuries. Pt states he has h/o palpitations, unknown etiology, no known h/o a-fib but unclear onset. Given 1L NS bolus per EMS  that finished shortly after arrival, given pain medications, no improvement in atrial fibrillation rate. Called to discuss with cardiology, started on diltiazem bolus and drip. Also given 1 additional liter of Ns. Admitted to trauma services with cardiology to follow. Updated pt on clavicle fx, t11 fx (nontender, neuro intact), and right 12th rib fx.   Pt condition, course, and admission were discussed with attending physician Dr. Rolan Bucco.  Final Clinical Impressions(s) / ED Diagnoses   Final diagnoses:  Electrocution  Atrial fibrillation with rapid ventricular response (HCC)  Closed nondisplaced fracture of acromial end of right clavicle,  initial encounter  Closed fracture of thoracic vertebral body (HCC)  Closed fracture of one rib of right side, initial encounter    New Prescriptions New Prescriptions   No medications on file     Horald Pollen, MD 07/17/16 1702    Rolan Bucco, MD 07/18/16 [id number]

## 2016-07-17 NOTE — ED Notes (Signed)
Girlfriend Number [phone] Paul Clarke(Courtney Gray)

## 2016-07-17 NOTE — ED Notes (Signed)
Patient transported to CT 

## 2016-07-17 NOTE — ED Notes (Signed)
Abrasion to right fore arm cleansed with saline and dressing applied. Patient has an area on his right 2nd finger  Looks like a blister that has busted and dry.

## 2016-07-17 NOTE — ED Notes (Signed)
Ortho paged. 

## 2016-07-17 NOTE — Progress Notes (Signed)
Responded to ED pager for colleague in meeting. Met patient's girlfriend in waiting room, provided emotional support, escorted her to patient's bedside. Introduced Social worker to patient and girlfriend as transition. Colleague to provide any further support.

## 2016-07-17 NOTE — ED Notes (Signed)
Meal ordered

## 2016-07-17 NOTE — ED Notes (Signed)
  Transported to Ct 

## 2016-07-17 NOTE — ED Notes (Signed)
Paged trauma MD for pain med order.

## 2016-07-17 NOTE — ED Notes (Signed)
Pt converted to ns after dry heaving.  EKG performed.

## 2016-07-17 NOTE — ED Notes (Signed)
Ice pack applied to right clavicle

## 2016-07-17 NOTE — Progress Notes (Signed)
   07/17/16 1125  Clinical Encounter Type  Visited With Patient and family together  Visit Type ED  Referral From Other (Comment) (Pager)  Spiritual Encounters  Spiritual Needs Emotional  Stress Factors  Patient Stress Factors Health changes  Family Stress Factors Family relationships  Pt and girlfriend in room. Provided emotional support services.

## 2016-07-17 NOTE — Progress Notes (Signed)
Orthopedic Tech Progress Note Patient Details:  Jeanie SewerRobert Vultaggio 07/10/1986 [id number]  Patient ID: Jeanie Sewerobert Chittum, male   DOB: 10/19/1985, 30 y.o.   MRN: [id number]   Nikki DomCrawford, Nikki Glanzer 07/17/2016, 11:22 AM Made level 2 trauma visit

## 2016-07-17 NOTE — ED Notes (Signed)
Patient received  of Fentanyl PTA

## 2016-07-17 NOTE — H&P (Signed)
Paul Clarke is an 30 y.o. male.   Chief Complaint: Electrocution with fall HPI: [name] was about 6 feet up a ladder when he grabbed hold of a live 277V line with his right hand. This threw him off the ladder and he landed hitting the back of his head and right shoulder. There was a loss of consciousness for 15-20s. The patient was amnestic for much longer than that. He was brought in as a level 2 trauma activation. He was found to have a clavicle fracture as well as afib w/RVR and HTN. Trauma was asked to consult.  History reviewed. No pertinent past medical history.  History reviewed. No pertinent surgical history.  History reviewed. No pertinent family history. Social History:  reports that he has been smoking.  He has never used smokeless tobacco. He reports that he drinks alcohol. He reports that he does not use drugs.  Allergies:  Allergies  Allergen Reactions  . Morphine And Related Other (See Comments)    intolerance     Results for orders placed or performed during the hospital encounter of 07/17/16 (from the past 48 hour(s))  CBC with Differential     Status: Abnormal   Collection Time: 07/17/16 11:09 AM  Result Value Ref Range   WBC 7.7 4.0 - 10.5 K/uL   RBC 5.51 4.22 - 5.81 MIL/uL   Hemoglobin 18.2 (H) 13.0 - 17.0 g/dL   HCT 52.0 39.0 - 52.0 %   MCV 94.4 78.0 - 100.0 fL   MCH 33.0 26.0 - 34.0 pg   MCHC 35.0 30.0 - 36.0 g/dL   RDW 14.1 11.5 - 15.5 %   Platelets 234 150 - 400 K/uL   Neutrophils Relative % 48 %   Neutro Abs 3.8 1.7 - 7.7 K/uL   Lymphocytes Relative 39 %   Lymphs Abs 3.0 0.7 - 4.0 K/uL   Monocytes Relative 9 %   Monocytes Absolute 0.7 0.1 - 1.0 K/uL   Eosinophils Relative 3 %   Eosinophils Absolute 0.2 0.0 - 0.7 K/uL   Basophils Relative 1 %   Basophils Absolute 0.1 0.0 - 0.1 K/uL  Comprehensive metabolic panel     Status: None   Collection Time: 07/17/16 11:09 AM  Result Value Ref Range   Sodium 137 135 - 145 mmol/L   Potassium 3.8 3.5 - 5.1  mmol/L   Chloride 104 101 - 111 mmol/L   CO2 23 22 - 32 mmol/L   Glucose, Bld 99 65 - 99 mg/dL   BUN 7 6 - 20 mg/dL   Creatinine, Ser 0.90 0.61 - 1.24 mg/dL   Calcium 8.9 8.9 - 10.3 mg/dL   Total Protein 6.5 6.5 - 8.1 g/dL   Albumin 3.9 3.5 - 5.0 g/dL   AST 30 15 - 41 U/L   ALT 22 17 - 63 U/L   Alkaline Phosphatase 59 38 - 126 U/L   Total Bilirubin 0.7 0.3 - 1.2 mg/dL   GFR calc non Af Amer >60 >60 mL/min   GFR calc Af Amer >60 >60 mL/min    Comment: (NOTE) The eGFR has been calculated using the CKD EPI equation. This calculation has not been validated in all clinical situations. eGFR's persistently <60 mL/min signify possible Chronic Kidney Disease.    Anion gap 10 5 - 15  Urinalysis, Routine w reflex microscopic (not at [facility])     Status: None   Collection Time: 07/17/16  1:48 PM  Result Value Ref Range   Color, Urine YELLOW YELLOW  APPearance CLEAR CLEAR   Specific Gravity, Urine 1.013 1.005 - 1.030   pH 7.5 5.0 - 8.0   Glucose, UA NEGATIVE NEGATIVE mg/dL   Hgb urine dipstick NEGATIVE NEGATIVE   Bilirubin Urine NEGATIVE NEGATIVE   Ketones, ur NEGATIVE NEGATIVE mg/dL   Protein, ur NEGATIVE NEGATIVE mg/dL   Nitrite NEGATIVE NEGATIVE   Leukocytes, UA NEGATIVE NEGATIVE    Comment: MICROSCOPIC NOT DONE ON URINES WITH NEGATIVE PROTEIN, BLOOD, LEUKOCYTES, NITRITE, OR GLUCOSE <1000 mg/dL.  I-Stat CG4 Lactic Acid, ED     Status: None   Collection Time: 07/17/16  2:01 PM  Result Value Ref Range   Lactic Acid, Venous 1.75 0.5 - 1.9 mmol/L  I-Stat Troponin, ED (not at [facility])     Status: None   Collection Time: 07/17/16  2:15 PM  Result Value Ref Range   Troponin i, poc 0.00 0.00 - 0.08 ng/mL   Comment 3            Comment: Due to the release kinetics of cTnI, a negative result within the first hours of the onset of symptoms does not rule out myocardial infarction with certainty. If myocardial infarction is still suspected, repeat the test at appropriate intervals.    Dg  Thoracic Spine 2 View  Result Date: 07/17/2016 CLINICAL DATA:  Fall with back pain.  Initial encounter. EXAM: THORACIC SPINE 2 VIEWS COMPARISON:  None. FINDINGS: Mild height loss along the T11 left-sided vertebral body with mild cortical irregularity also seen in the lateral projection. No subluxation. No degenerative changes. IMPRESSION: Probable T11 body fracture with mild height loss. Electronically Signed   By: Monte Fantasia M.D.   On: 07/17/2016 13:04   Dg Lumbar Spine Complete  Result Date: 07/17/2016 CLINICAL DATA:  Level 2 trauma. Fall from ladder. Back pain. Initial encounter. EXAM: LUMBAR SPINE - COMPLETE 4+ VIEW COMPARISON:  None. FINDINGS: Nondisplaced right twelfth rib fracture. No evidence of lumbar spine fracture or subluxation. IMPRESSION: Nondisplaced right twelfth rib fracture. Negative lumbar spine. Electronically Signed   By: Monte Fantasia M.D.   On: 07/17/2016 13:02   Dg Shoulder Right  Result Date: 07/17/2016 CLINICAL DATA:  30 year old male post trauma (electrocuted and thrown from ladder). Initial encounter. EXAM: RIGHT SHOULDER - 2+ VIEW COMPARISON:  None. FINDINGS: No fracture or dislocation. The minimal indentation of the lateral right humeral head is felt to represent a normal finding rather than result of fracture or impaction injury. No abnormality seen in this region on accompanying right humerus films. IMPRESSION: No fracture or dislocation.  Please see above. Electronically Signed   By: Genia Del M.D.   On: 07/17/2016 13:05   Ct Head Wo Contrast  Result Date: 07/17/2016 CLINICAL DATA:  Electrocution with fall from ladder EXAM: CT HEAD WITHOUT CONTRAST CT CERVICAL SPINE WITHOUT CONTRAST TECHNIQUE: Multidetector CT imaging of the head and cervical spine was performed following the standard protocol without intravenous contrast. Multiplanar CT image reconstructions of the cervical spine were also generated. COMPARISON:  None. FINDINGS: CT HEAD FINDINGS Brain: The  ventricles are normal in size and configuration. There is no intracranial mass hemorrhage, extra-axial fluid collection, or midline shift. Gray-white compartments are normal. There is no evident acute infarct. Vascular: No hyperdense vessel. Middle cerebral arteries show equivalent attenuation bilaterally. There are no foci of arterial vascular calcification. Skull: Bony calvarium appears intact. Sinuses/Orbits: There is mucosal thickening in ethmoid air cells bilaterally anteriorly, full left normal height. There is mild mucosal thickening in the posterior right maxillary antrum.  Other paranasal sinuses clear. Orbits appear symmetric bilaterally. Other: Mastoid air cells are clear. CT CERVICAL SPINE FINDINGS Alignment: There is no spondylolisthesis. Skull base and vertebrae: Skull base and craniocervical junction regions appear normal. There is no evident fracture. No blastic or lytic bone lesions. Soft tissues and spinal canal: Prevertebral soft tissues and predental space regions are normal. No spinal stenosis. No paraspinous lesions evident. Disc levels: There is slight disc space narrowing at C6-7. Other disc spaces appear normal. No nerve root edema or effacement. No disc extrusion or stenosis. Upper chest: No abnormality noted. Other: None IMPRESSION: CT head: No intracranial mass, hemorrhage, or extra-axial fluid collection. Gray-white compartments appear normal. No fractures evident. There are areas of paranasal sinus disease. CT cervical spine: No fracture or spondylolisthesis. Slight disc space narrowing C6-7. No prevertebral effaced. Disc extrusion or stenosis. Electronically Signed   By: Lowella Grip III M.D.   On: 07/17/2016 12:35   Ct Chest W Contrast  Result Date: 07/17/2016 CLINICAL DATA:  Level 2 trauma. Electrocution with fall. Suggestion of the T11 vertebral body fracture on thoracic spine radiographs from earlier today. EXAM: CT CHEST WITH CONTRAST TECHNIQUE: Multidetector CT imaging of  the chest was performed during intravenous contrast administration. CONTRAST:  152m ISOVUE-300 IOPAMIDOL (ISOVUE-300) INJECTION 61% COMPARISON:  07/17/2016 thoracic spine radiographs. FINDINGS: Cardiovascular: Normal heart size. No significant pericardial fluid/thickening. Great vessels are normal in course and caliber. No evidence of acute thoracic aortic injury. No central pulmonary emboli. Mediastinum/Nodes: No pneumomediastinum. No mediastinal hematoma. No discrete thyroid nodules. Unremarkable esophagus. No axillary, mediastinal or hilar lymphadenopathy. Lungs/Pleura: No pneumothorax. No pleural effusion. Minimal paraseptal emphysema at the lung apices. No acute consolidative airspace disease, lung masses or significant pulmonary nodules. No pneumatoceles. Upper abdomen: Unremarkable. Musculoskeletal: No aggressive appearing focal osseous lesions. There is a comminuted medial right clavicle fracture, which appears to involve the anterior superior margin of the right sternoclavicular joint, with mild overriding of the fracture fragments and 1 cm anterior displacement of the dominant lateral fracture fragment. There is associated intramuscular hematoma in the lower right sternocleidomastoid. There is a mild compression fracture involving the anterior superior T11 vertebral body with minimal 10-20% loss of vertebral height anteriorly. Remaining thoracic vertebral body heights are preserved. No thoracic spine subluxation. No bony retropulsion of fragments at the T11 level. No additional fracture. IMPRESSION: 1. Comminuted intra-articular medial right clavicle fracture, with displacement and overriding as described. 2. Mild anterior superior T11 vertebral body compression fracture. 3. No pneumothorax.  No pleural effusions. 4. Minimal paraseptal emphysema at the lung apices. No acute cardiopulmonary disease. Electronically Signed   By: JIlona SorrelM.D.   On: 07/17/2016 15:07   Ct Cervical Spine Wo  Contrast  Result Date: 07/17/2016 CLINICAL DATA:  Electrocution with fall from ladder EXAM: CT HEAD WITHOUT CONTRAST CT CERVICAL SPINE WITHOUT CONTRAST TECHNIQUE: Multidetector CT imaging of the head and cervical spine was performed following the standard protocol without intravenous contrast. Multiplanar CT image reconstructions of the cervical spine were also generated. COMPARISON:  None. FINDINGS: CT HEAD FINDINGS Brain: The ventricles are normal in size and configuration. There is no intracranial mass hemorrhage, extra-axial fluid collection, or midline shift. Gray-white compartments are normal. There is no evident acute infarct. Vascular: No hyperdense vessel. Middle cerebral arteries show equivalent attenuation bilaterally. There are no foci of arterial vascular calcification. Skull: Bony calvarium appears intact. Sinuses/Orbits: There is mucosal thickening in ethmoid air cells bilaterally anteriorly, full left normal height. There is mild mucosal thickening in the  posterior right maxillary antrum. Other paranasal sinuses clear. Orbits appear symmetric bilaterally. Other: Mastoid air cells are clear. CT CERVICAL SPINE FINDINGS Alignment: There is no spondylolisthesis. Skull base and vertebrae: Skull base and craniocervical junction regions appear normal. There is no evident fracture. No blastic or lytic bone lesions. Soft tissues and spinal canal: Prevertebral soft tissues and predental space regions are normal. No spinal stenosis. No paraspinous lesions evident. Disc levels: There is slight disc space narrowing at C6-7. Other disc spaces appear normal. No nerve root edema or effacement. No disc extrusion or stenosis. Upper chest: No abnormality noted. Other: None IMPRESSION: CT head: No intracranial mass, hemorrhage, or extra-axial fluid collection. Gray-white compartments appear normal. No fractures evident. There are areas of paranasal sinus disease. CT cervical spine: No fracture or spondylolisthesis.  Slight disc space narrowing C6-7. No prevertebral effaced. Disc extrusion or stenosis. Electronically Signed   By: Lowella Grip III M.D.   On: 07/17/2016 12:35   Dg Chest Portable 1 View  Result Date: 07/17/2016 CLINICAL DATA:  Trauma, right upper chest pain. EXAM: PORTABLE CHEST 1 VIEW COMPARISON:  None. FINDINGS: Heart and mediastinal contours are within normal limits. No focal opacities or effusions. No acute bony abnormality. No pneumothorax. No visible rib fracture. IMPRESSION: No active disease. Electronically Signed   By: Rolm Baptise M.D.   On: 07/17/2016 11:28   Dg Humerus Right  Result Date: 07/17/2016 CLINICAL DATA:  Pain with movement of right arm after electrocution and fall from ladder. EXAM: RIGHT HUMERUS - 2+ VIEW COMPARISON:  None. FINDINGS: There is no evidence of fracture or other focal bone lesions. Soft tissues are unremarkable. IMPRESSION: Negative. Electronically Signed   By: Misty Stanley M.D.   On: 07/17/2016 13:10    Review of Systems  Constitutional: Negative for weight loss.  HENT: Negative for ear discharge, ear pain, hearing loss and tinnitus.   Eyes: Negative for blurred vision, double vision, photophobia and pain.  Respiratory: Negative for cough, sputum production and shortness of breath.   Cardiovascular: Negative for chest pain.  Gastrointestinal: Negative for abdominal pain, nausea and vomiting.  Genitourinary: Negative for dysuria, flank pain, frequency and urgency.  Musculoskeletal: Positive for neck pain. Negative for back pain, falls, joint pain and myalgias.  Neurological: Positive for loss of consciousness and headaches. Negative for dizziness, tingling, sensory change and focal weakness.  Endo/Heme/Allergies: Does not bruise/bleed easily.  Psychiatric/Behavioral: Negative for depression, memory loss and substance abuse. The patient is not nervous/anxious.     Blood pressure 143/97, pulse (!) 150, temperature 98.2 F (36.8 C), temperature  source Oral, resp. rate 20, SpO2 98 %. Physical Exam  Vitals reviewed. Constitutional: He is oriented to person, place, and time. He appears well-developed and well-nourished. He is cooperative. No distress.  HENT:  Head: Normocephalic. Head is with abrasion. Head is without raccoon's eyes, without Battle's sign, without contusion and without laceration.  Right Ear: Hearing, tympanic membrane, external ear and ear canal normal. No lacerations. No drainage or tenderness. No foreign bodies. Tympanic membrane is not perforated. No hemotympanum.  Left Ear: Hearing, tympanic membrane, external ear and ear canal normal. No lacerations. No drainage or tenderness. No foreign bodies. Tympanic membrane is not perforated. No hemotympanum.  Nose: Nose normal. No nose lacerations, sinus tenderness, nasal deformity or nasal septal hematoma. No epistaxis.  Mouth/Throat: Uvula is midline, oropharynx is clear and moist and mucous membranes are normal. No lacerations. No oropharyngeal exudate.  Eyes: Conjunctivae, EOM and lids are normal. Pupils are equal,  round, and reactive to light. Right eye exhibits no discharge. Left eye exhibits no discharge. No scleral icterus.  Neck: Trachea normal. No JVD present. Muscular tenderness (Right base) present. No spinous process tenderness present. Carotid bruit is not present. No tracheal deviation present. No thyromegaly present.  Cardiovascular: Normal heart sounds, intact distal pulses and normal pulses.  An irregularly irregular rhythm present. Tachycardia present.  Exam reveals no gallop and no friction rub.   No murmur heard. Respiratory: Effort normal and breath sounds normal. No stridor. No respiratory distress. He has no wheezes. He has no rales. He exhibits no tenderness, no bony tenderness, no laceration and no crepitus.  GI: Soft. Normal appearance and bowel sounds are normal. He exhibits no distension. There is no tenderness. There is no rigidity, no rebound, no  guarding and no CVA tenderness.  Genitourinary: Penis normal.  Musculoskeletal: Normal range of motion. He exhibits no edema or tenderness.       Hands: Lymphadenopathy:    He has no cervical adenopathy.  Neurological: He is alert and oriented to person, place, and time. He has normal strength. No cranial nerve deficit or sensory deficit. GCS eye subscore is 4. GCS verbal subscore is 5. GCS motor subscore is 6.  Skin: Skin is warm, dry and intact. He is not diaphoretic.  Psychiatric: He has a normal mood and affect. His speech is normal and behavior is normal.     Assessment/Plan Electrocution with fall -- Check CK Concussion Right clavicle fx -- Dr. Marcelino Scot to consult Afib w/RVR -- cardiology to consult  Admit to 2H    Lisette Abu, PA-C Pager: [phone] General Trauma PA Pager: [phone] 07/17/2016, 4:18 PM

## 2016-07-18 ENCOUNTER — Encounter (HOSPITAL_COMMUNITY): Payer: Self-pay | Admitting: Cardiology

## 2016-07-18 DIAGNOSIS — T754XXA Electrocution, initial encounter: Principal | ICD-10-CM | POA: Diagnosis present

## 2016-07-18 DIAGNOSIS — S060XAA Concussion with loss of consciousness status unknown, initial encounter: Secondary | ICD-10-CM | POA: Diagnosis present

## 2016-07-18 DIAGNOSIS — W11XXXA Fall on and from ladder, initial encounter: Secondary | ICD-10-CM

## 2016-07-18 DIAGNOSIS — S42001A Fracture of unspecified part of right clavicle, initial encounter for closed fracture: Secondary | ICD-10-CM | POA: Diagnosis present

## 2016-07-18 DIAGNOSIS — S060X9A Concussion with loss of consciousness of unspecified duration, initial encounter: Secondary | ICD-10-CM | POA: Diagnosis present

## 2016-07-18 DIAGNOSIS — I4891 Unspecified atrial fibrillation: Secondary | ICD-10-CM

## 2016-07-18 LAB — BASIC METABOLIC PANEL
Anion gap: 9 (ref 5–15)
CALCIUM: 8.7 mg/dL — AB (ref 8.9–10.3)
CO2: 24 mmol/L (ref 22–32)
Chloride: 102 mmol/L (ref 101–111)
Creatinine, Ser: 0.75 mg/dL (ref 0.61–1.24)
GFR calc Af Amer: >60 mL/min (ref >60)
GLUCOSE: 102 mg/dL — AB (ref 65–99)
Potassium: 3.7 mmol/L (ref 3.5–5.1)
SODIUM: 135 mmol/L (ref 135–145)

## 2016-07-18 LAB — CBC
HCT: 48.8 % (ref 39.0–52.0)
Hemoglobin: 16.5 g/dL (ref 13.0–17.0)
MCH: 32.2 pg (ref 26.0–34.0)
MCHC: 33.8 g/dL (ref 30.0–36.0)
MCV: 95.1 fL (ref 78.0–100.0)
PLATELETS: 213 10*3/uL (ref 150–400)
RBC: 5.13 MIL/uL (ref 4.22–5.81)
RDW: 14.3 % (ref 11.5–15.5)
WBC: 10.4 10*3/uL (ref 4.0–10.5)

## 2016-07-18 LAB — CK: CK TOTAL: 143 U/L (ref 49–397)

## 2016-07-18 LAB — MRSA PCR SCREENING: MRSA by PCR: NEGATIVE

## 2016-07-18 MED ORDER — INFLUENZA VAC SPLIT QUAD 0.5 ML IM SUSY
0.5000 mL | PREFILLED_SYRINGE | INTRAMUSCULAR | Status: AC
  Administered 2016-07-19: 0.5 mL via INTRAMUSCULAR
  Filled 2016-07-18: qty 0.5

## 2016-07-18 MED ORDER — HYDROCODONE-ACETAMINOPHEN 10-325 MG PO TABS
0.5000 | ORAL_TABLET | ORAL | Status: DC | PRN
  Administered 2016-07-18 – 2016-07-19 (×5): 2 via ORAL
  Filled 2016-07-18 (×5): qty 2

## 2016-07-18 MED ORDER — POLYETHYLENE GLYCOL 3350 17 G PO PACK
17.0000 g | PACK | Freq: Every day | ORAL | Status: DC
  Filled 2016-07-18: qty 1

## 2016-07-18 MED ORDER — HYDROMORPHONE HCL 1 MG/ML IJ SOLN
0.5000 mg | INTRAMUSCULAR | Status: DC | PRN
  Administered 2016-07-18 (×3): 0.5 mg via INTRAVENOUS
  Filled 2016-07-18 (×3): qty 1

## 2016-07-18 NOTE — Consult Note (Signed)
Orthopaedic Trauma Service consult   Reason: Right medial clavicle fracture Requesting: Trauma Service   HPI 30 year old right-hand-dominant white male was electrocuted yesterday while at work. Patient fell off a ladder landing on his right side. Patient was working at [employer] on some wires when a live wire touched him causing him to fall off a ladder. Patient was brought to [place] and admitted to the trauma service. Patient was also in A. fib on arrival but has some converted to normal sinus rhythm overnight. On CT scan he was found to have a comminuted right medial clavicle fracture with some extension into the sternoclavicular joint. Orthopedic trauma service consultation for management. Patient seen and evaluated today he does complain of some right shoulder and neck pain as well as some back pain related to his superior T11 compression fracture. Patient denies any numbness or tingling in his right upper extremity. Denies any additional injuries to his other extremities.   History reviewed. No pertinent past medical history.  Surgical history  ORIF right hand [facility]. [facility]  Removal of hardware right hand. [facility]  History reviewed. No pertinent family history.  Allergies  Allergen Reactions  . Morphine And Related Other (See Comments)    07/18/16:  a rash that is "not serious", tolerates Hydromorphone   Social History   Social History  . Marital status: Single    Spouse name: N/A  . Number of children: N/A  . Years of education: N/A   Occupational History  . Not on file.   Social History Main Topics  . Smoking status: Current Some Day Smoker  . Smokeless tobacco: Never Used  . Alcohol use Yes  . Drug use: No  . Sexual activity: Not on file   Other Topics Concern  . Not on file   Social History Narrative  . No narrative on file   Patient smokes about 3 to 4 black and mild cigars daily No outpatient prescriptions  have been marked as taking for the 07/17/16 encounter [facility] Encounter).     ROS As above in the history of present illness  Objective   BP 137/82 (BP Location: Left Arm)   Pulse 78   Temp 98 F (36.7 C) (Oral)   Resp (!) 26   Ht 5' 7"  (1.702 m)   Wt 81.6 kg (180 lb)   SpO2 90%   BMI 28.19 kg/m   Intake/Output      10/09 0701 - 10/10 0700 10/10 0701 - 10/11 0700   P.O. 240    I.V. (mL/kg) 1500 (18.4)    IV Piggyback 1000    Total Intake(mL/kg) 2740 (33.6)    Urine (mL/kg/hr) 1400    Total Output 1400     Net +1340          Labs Results for SAGAN, MASELLI (MRN [id number]) as of 07/18/2016 11:38  Ref. Range 07/18/2016 04:02  Sodium Latest Ref Range: 135 - 145 mmol/L 135  Potassium Latest Ref Range: 3.5 - 5.1 mmol/L 3.7  Chloride Latest Ref Range: 101 - 111 mmol/L 102  CO2 Latest Ref Range: 22 - 32 mmol/L 24  BUN Latest Ref Range: 6 - 20 mg/dL <5 (L)  Creatinine Latest Ref Range: 0.61 - 1.24 mg/dL 0.75  Calcium Latest Ref Range: 8.9 - 10.3 mg/dL 8.7 (L)  EGFR (Non-African Amer.) Latest Ref Range: >60 mL/min >60  EGFR (African American) Latest Ref Range: >60 mL/min >60  Glucose Latest Ref Range: 65 - 99  mg/dL 102 (H)  Anion gap Latest Ref Range: 5 - 15  9  CK Total Latest Ref Range: 49 - 397 U/L 143  WBC Latest Ref Range: 4.0 - 10.5 K/uL 10.4  RBC Latest Ref Range: 4.22 - 5.81 MIL/uL 5.13  Hemoglobin Latest Ref Range: 13.0 - 17.0 g/dL 16.5  HCT Latest Ref Range: 39.0 - 52.0 % 48.8  MCV Latest Ref Range: 78.0 - 100.0 fL 95.1  MCH Latest Ref Range: 26.0 - 34.0 pg 32.2  MCHC Latest Ref Range: 30.0 - 36.0 g/dL 33.8  RDW Latest Ref Range: 11.5 - 15.5 % 14.3  Platelets Latest Ref Range: 150 - 400 K/uL 213    Exam  Gen: Awake and alert, sitting comfortably in bed in no acute distress  Ext:       Right Upper Extremity   Right arm is in a sling  Extremity is warm  Swelling noted about the [place] joint  Tender to palpation along [place] joint  No pulsatile  masses  Palpable radial pulse  Radial, ulnar, median Nerve motor and sensory functions are intact  Patient does have a burn marks from the wire between his middle and ring finger  Hand is nontender  Elbow and upper arm are nontender. No pain with palpation over his ac joint or mid shaft clavicle         Left upper extremity   shoulder, elbow, wrist, digits- no skin wounds, nontender, no instability, no blocks to motion  Sens  Ax/R/M/U intact  Mot   Ax/ R/ PIN/ M/ AIN/ U intact  Rad 2+       Bilateral lower extremities   Nontender  No effusions  Knee stable to varus/ valgus and anterior/posterior stress  Sens DPN, SPN, TN intact  Motor EHL, ext, flex, evers 5/5  DP 2+, PT 2+, No significant edema    Imaging  CT chest reviewed: Comminuted right medial clavicle fracture with mild extension into the [place] joint. No posterior displacement appreciated  Assessment and Plan   POD/HD#:   30 year old male electrocution with fall off ladder  -Right medial clavicle fracture  Nonoperative treatment  Sling for comfort  Ice as needed for swelling and pain control  I did cut off the immobilizer portion of the sling  Range of motion as tolerated  Patient can use right arm as tolerated  We did discuss cessation of nicotine products as it relates to delayed bone healing  Patient does not necessarily need occupational therapy evaluation but can have that if he so desires  - Dispo:  Per trauma service  Follow-up with orthopedics in 2 weeks    Jari Pigg, PA-C [facility] [phone] ([phone] (O) 07/18/2016 11:29 AM

## 2016-07-18 NOTE — Progress Notes (Signed)
Orthopedic Tech Progress Note Patient Details:  Jeanie SewerRobert Veselka 02/15/1986 [id number]  Patient ID: Jeanie Sewerobert Mednick, male   DOB: 07/20/1986, 30 y.o.   MRN: [id number]   Nikki DomCrawford, Sama Arauz 07/18/2016, 1:18 PM Called in bio-tech brace order; [facility]

## 2016-07-18 NOTE — Clinical Social Work Note (Signed)
Clinical Social Work Assessment  Patient Details  Name: Paul Clarke MRN: [id number] Date of Birth: 1986-06-12  Date of referral:  07/18/16               Reason for consult:  Trauma                Permission sought to share information with:    Permission granted to share information::  No  Name::        Agency::     Relationship::     Contact Information:     Housing/Transportation Living arrangements for the past 2 months:  Single Family Home Source of Information:  Patient, Medical Team Patient Interpreter Needed:  None Criminal Activity/Legal Involvement Pertinent to Current Situation/Hospitalization:  No - Comment as needed Significant Relationships:  Significant Other Lives with:    Do you feel safe going back to the place where you live?  Yes Need for family participation in patient care:  Yes (Comment)  Care giving concerns:  Trauma assessment and SBIRT   Social Worker assessment / plan:  CSW met with patient. No supports at bedside. CSW introduced role and explained need for trauma assessment and SBIRT. Patient agreeable. Patient reports that while working at [employer], he was electrocuted and fell off of a ladder. Patient has a video of the event from the church's camera system and showed CSW. SBIRT completed. Patient reports that he does not regularly drink but usually only does so while watching a football game. Patient reports drinking while watching the football game the night before the accident. Patient stated that he never drinks during the week. No further concerns. CSW signing off as social work intervention is no longer needed.  Employment status:  [employer] information:  Other (Comment Required) (Generic Worker's Comp) PT Recommendations:  Not assessed at this time Information / Referral to community resources:  SBIRT  Patient/Family's Response to care:  Patient agreeable to trauma assessment and SBIRT. Patient's girlfriend supportive and involved in  patient's care. Patient appreciated social work intervention.  Patient/Family's Understanding of and Emotional Response to Diagnosis, Current Treatment, and Prognosis:  Patient understands the reason for trauma assessment and SBIRT. Patient appears happy with hospital care but eager to return home.  Emotional Assessment Appearance:  Appears stated age Attitude/Demeanor/Rapport:  Other (Pleasant) Affect (typically observed):  Accepting, Appropriate, Calm, Pleasant Orientation:  Oriented to Self, Oriented to Place, Oriented to  Time, Oriented to Situation Alcohol / Substance use:  Alcohol Use (Sporadic) Psych involvement (Current and /or in the community):  No (Comment)  Discharge Needs  Concerns to be addressed:  No discharge needs identified Readmission within the last 30 days:  No Current discharge risk:  None Barriers to Discharge:  No Barriers Identified   Candie Chroman, LCSW 07/18/2016, 3:36 PM

## 2016-07-18 NOTE — Care Management Note (Signed)
Case Management Note  Patient Details  Name: Jeanie SewerRobert Brenneman MRN: [id number] Date of Birth: 06/13/1986  Subjective/Objective:  Pt admitted on 07/17/16 s/p electrocution with Afib/RVR, clavicle fx, and T11 compression fx.  PTA, pt independent of ADLS.                     Action/Plan: Pt plans to dc to his mother's home at discharge to recover from his injuries.   Case is work-related; referred me to Janne NapoleonGillian in human resources at his place of employment.    Expected Discharge Date:                  Expected Discharge Plan:  Home/Self Care  In-House Referral:  Clinical Social Work  Discharge planning Services  CM Consult  Post Acute Care Choice:    Choice offered to:     DME Arranged:    DME Agency:     HH Arranged:    HH Agency:     Status of Service:  In process, will continue to follow  If discussed at Long Length of Stay Meetings, dates discussed:    Additional Comments:  Spoke with Janne NapoleonGillian [phone]) in Human Resource Dept at patient's place of work.  She states she is waiting for Worker's Comp Case Manager to call her today.  Left message to have WC Case Mgr call me.  She states she will give them my number.    Quintella BatonJulie W. Thi Sisemore, RN, BSN  Trauma/Neuro ICU Case Manager [phone]

## 2016-07-18 NOTE — Care Management Note (Signed)
Case Management Note  Patient Details  Name: Paul SewerRobert Fruchter MRN: [id number] Date of Birth: 04/18/1986  Subjective/Objective:                    Action/Plan:   Expected Discharge Date:  07/19/16               Expected Discharge Plan:  Home/Self Care  In-House Referral:  Clinical Social Work  Discharge planning Services  CM Consult  Post Acute Care Choice:    Choice offered to:     DME Arranged:    DME Agency:     HH Arranged:    HH Agency:     Status of Service:  In process, will continue to follow  If discussed at Long Length of Stay Meetings, dates discussed:    Additional Comments:  Charlett BlakeFelichia Jorgina Binning 07/18/2016, 9:41 PM

## 2016-07-18 NOTE — Progress Notes (Signed)
Orthopedic Tech Progress Note Patient Details:  Paul Clarke 11/24/1985 [id number] Brace order completed by bio-tech vendor. Patient ID: Paul Clarke, male   DOB: 06/01/1986, 30 y.o.   MRN: [id number]   Paul Clarke, Paul Clarke 07/18/2016, 3:13 PM

## 2016-07-18 NOTE — Consult Note (Signed)
Cardiology Consult    Patient ID: Paul Clarke MRN: [id number], DOB/AGE: 30 years old   Admit date: 07/17/2016 Date of Consult: 07/18/2016  Primary Physician: [name] Patient Primary Cardiologist: n/a - will follow up with Bryan Lemma, MD Requesting Provider: Dr. Violeta Gelinas.   Patient Profile    30 year old gentleman with a pretty significant family history of coronary artery disease in his mother and father as well as maternal grandfather and uncles who was found to have atrial fibrillation following an electrocution. Spontaneously converted.  We are consulted for new onset atrial fibrillation  Past Medical History   History reviewed. No pertinent past medical history.  History reviewed. No pertinent surgical history.   Allergies  Allergies  Allergen Reactions  . Morphine And Related Other (See Comments)    07/18/16:  a rash that is "not serious", tolerates Hydromorphone    History of Present Illness    Paul Clarke was on a 6 foot ladder working on the [employer] system in the [employer]. He apparently accidentally grabbed hold of a live 277 V cable with his right hand. He then was thrown from ladder landing on on his back and right shoulder. He had roughly half a minute or loss of consciousness and was amnestic of the entire episode.  He was a minute by trauma for management of click the clavicular fracture. However he was also found to be in A. fib RVR that he started feeling once he arrived here. He was initially treated with IV diltiazem, and then noted that he had episode of nausea with near emesis. After a couple dry heaves, and the nurse taking care of him noted that he spontaneously converted to sinus rhythm. He has remained in sinus rhythm throughout the rest of the hospital stay.  He noted the sensation of rapid irregular heart beating once he arrived here to [facility], but did not feel that while transported.  Inpatient Medications    . docusate sodium   100 mg Oral BID  . enoxaparin (LOVENOX) injection  40 mg Subcutaneous Q24H  . [START ON 07/19/2016] Influenza vac split quadrivalent PF  0.5 mL Intramuscular Tomorrow-1000  . polyethylene glycol  17 g Oral Daily    Family History    Family History  Problem Relation Age of Onset  . Coronary artery disease Mother 7  . Coronary artery disease Father 13    Status post CABG  . Coronary artery disease Paternal Grandfather   . Coronary artery disease Paternal Uncle     In his 70s  . Stroke Paternal Uncle     Social History    Social History   Social History  . Marital status: Single    Spouse name: N/A  . Number of children: N/A  . Years of education: N/A   Occupational History  . Not on file.   Social History Main Topics  . Smoking status: Current Some Day Smoker  . Smokeless tobacco: Never Used  . Alcohol use Yes  . Drug use: No  . Sexual activity: Not on file   Other Topics Concern  . Not on file   Social History Narrative  . No narrative on file     Review of Systems    General:  No chills, fever, night sweats or weight changes.  Cardiovascular:  No chest pain, dyspnea on exertion, edema, orthopnea, palpitations, paroxysmal nocturnal dyspnea.  -- Has never had similar symptoms to the rapid heart rates that he had let yesterday. Dermatological: No rash,  lesions/masses Respiratory: No cough, dyspnea Urologic: No hematuria, dysuria Abdominal:   No nausea, vomiting, diarrhea, bright red blood per rectum, melena, or hematemesis Neurologic:  No visual changes, wkns, changes in mental status. All other systems reviewed and are otherwise negative except as noted above. Prior to his electrocution and fall, he was feeling fine.  Physical Exam    Blood pressure (!) 121/94, pulse 78, temperature 98.2 F (36.8 C), temperature source Oral, resp. rate 19, height 5\' 7"  (1.702 m), weight 81.6 kg (180 lb), SpO2 96 %.  General: Pleasant, NAD Psych: Normal affect. Neuro:  Alert and oriented X 3. Moves all extremities spontaneously.  HEENT: Normal  Neck: Supple without bruits or JVD. Lungs:  Resp regular and unlabored, CTA. Heart: RRR no s3, s4, or murmurs. Abdomen: Soft, non-tender, non-distended, BS + x 4.  Extremities: No clubbing, cyanosis or edema. DP/PT/Radials 2+ and equal bilaterally. This significant right shoulder and neck pain following injury.  Labs    Troponin [facility](Point of Care Test)  Recent Labs  07/17/16 1707  TROPIPOC 0.00    Recent Labs  07/17/16 1109 07/18/16 0402  CKTOTAL 71 143   Lab Results  Component Value Date   WBC 10.4 07/18/2016   HGB 16.5 07/18/2016   HCT 48.8 07/18/2016   MCV 95.1 07/18/2016   PLT 213 07/18/2016     Recent Labs Lab 07/17/16 1109 07/18/16 0402  NA 137 135  K 3.8 3.7  CL 104 102  CO2 23 24  BUN 7 <5*  CREATININE 0.90 0.75  CALCIUM 8.9 8.7*  PROT 6.5  --   BILITOT 0.7  --   ALKPHOS 59  --   ALT 22  --   AST 30  --   GLUCOSE 99 102*   No results found for: CHOL, HDL, LDLCALC, TRIG No results found for: [facility]   Radiology Studies    Dg Thoracic Spine 2 View  Result Date: 07/17/2016 CLINICAL DATA:  Fall with back pain.  Initial encounter. EXAM: THORACIC SPINE 2 VIEWS COMPARISON:  None. FINDINGS: Mild height loss along the T11 left-sided vertebral body with mild cortical irregularity also seen in the lateral projection. No subluxation. No degenerative changes. IMPRESSION: Probable T11 body fracture with mild height loss. Electronically Signed   By: Marnee SpringJonathon  Watts M.D.   On: 07/17/2016 13:04   Dg Lumbar Spine Complete  Result Date: 07/17/2016 CLINICAL DATA:  Level 2 trauma. Fall from ladder. Back pain. Initial encounter. EXAM: LUMBAR SPINE - COMPLETE 4+ VIEW COMPARISON:  None. FINDINGS: Nondisplaced right twelfth rib fracture. No evidence of lumbar spine fracture or subluxation. IMPRESSION: Nondisplaced right twelfth rib fracture. Negative lumbar spine. Electronically Signed   By: Marnee SpringJonathon   Watts M.D.   On: 07/17/2016 13:02   Dg Shoulder Right  Result Date: 07/17/2016 CLINICAL DATA:  30 year old male post trauma (electrocuted and thrown from ladder). Initial encounter. EXAM: RIGHT SHOULDER - 2+ VIEW COMPARISON:  None. FINDINGS: No fracture or dislocation. The minimal indentation of the lateral right humeral head is felt to represent a normal finding rather than result of fracture or impaction injury. No abnormality seen in this region on accompanying right humerus films. IMPRESSION: No fracture or dislocation.  Please see above. Electronically Signed   By: Lacy DuverneySteven  Olson M.D.   On: 07/17/2016 13:05   Ct Head Wo Contrast  Result Date: 07/17/2016 CLINICAL DATA:  Electrocution with fall from ladder EXAM: CT HEAD WITHOUT CONTRAST CT CERVICAL SPINE WITHOUT CONTRAST TECHNIQUE: Multidetector CT imaging of the head  and cervical spine was performed following the standard protocol without intravenous contrast. Multiplanar CT image reconstructions of the cervical spine were also generated. COMPARISON:  None. FINDINGS: CT HEAD FINDINGS Brain: The ventricles are normal in size and configuration. There is no intracranial mass hemorrhage, extra-axial fluid collection, or midline shift. Gray-white compartments are normal. There is no evident acute infarct. Vascular: No hyperdense vessel. Middle cerebral arteries show equivalent attenuation bilaterally. There are no foci of arterial vascular calcification. Skull: Bony calvarium appears intact. Sinuses/Orbits: There is mucosal thickening in ethmoid air cells bilaterally anteriorly, full left normal height. There is mild mucosal thickening in the posterior right maxillary antrum. Other paranasal sinuses clear. Orbits appear symmetric bilaterally. Other: Mastoid air cells are clear. CT CERVICAL SPINE FINDINGS Alignment: There is no spondylolisthesis. Skull base and vertebrae: Skull base and craniocervical junction regions appear normal. There is no evident  fracture. No blastic or lytic bone lesions. Soft tissues and spinal canal: Prevertebral soft tissues and predental space regions are normal. No spinal stenosis. No paraspinous lesions evident. Disc levels: There is slight disc space narrowing at C6-7. Other disc spaces appear normal. No nerve root edema or effacement. No disc extrusion or stenosis. Upper chest: No abnormality noted. Other: None IMPRESSION: CT head: No intracranial mass, hemorrhage, or extra-axial fluid collection. Gray-white compartments appear normal. No fractures evident. There are areas of paranasal sinus disease. CT cervical spine: No fracture or spondylolisthesis. Slight disc space narrowing C6-7. No prevertebral effaced. Disc extrusion or stenosis. Electronically Signed   By: Bretta Bang III M.D.   On: 07/17/2016 12:35   Ct Chest W Contrast  Result Date: 07/17/2016 CLINICAL DATA:  Level 2 trauma. Electrocution with fall. Suggestion of the T11 vertebral body fracture on thoracic spine radiographs from earlier today. EXAM: CT CHEST WITH CONTRAST TECHNIQUE: Multidetector CT imaging of the chest was performed during intravenous contrast administration. CONTRAST:  ISOVUE-300 IOPAMIDOL (ISOVUE-300) INJECTION 61% COMPARISON:  07/17/2016 thoracic spine radiographs. FINDINGS: Cardiovascular: Normal heart size. No significant pericardial fluid/thickening. Great vessels are normal in course and caliber. No evidence of acute thoracic aortic injury. No central pulmonary emboli. Mediastinum/Nodes: No pneumomediastinum. No mediastinal hematoma. No discrete thyroid nodules. Unremarkable esophagus. No axillary, mediastinal or hilar lymphadenopathy. Lungs/Pleura: No pneumothorax. No pleural effusion. Minimal paraseptal emphysema at the lung apices. No acute consolidative airspace disease, lung masses or significant pulmonary nodules. No pneumatoceles. Upper abdomen: Unremarkable. Musculoskeletal: No aggressive appearing focal osseous lesions.  There is a comminuted medial right clavicle fracture, which appears to involve the anterior superior margin of the right sternoclavicular joint, with mild overriding of the fracture fragments and 1 cm anterior displacement of the dominant lateral fracture fragment. There is associated intramuscular hematoma in the lower right sternocleidomastoid. There is a mild compression fracture involving the anterior superior T11 vertebral body with minimal 10-20% loss of vertebral height anteriorly. Remaining thoracic vertebral body heights are preserved. No thoracic spine subluxation. No bony retropulsion of fragments at the T11 level. No additional fracture. IMPRESSION: 1. Comminuted intra-articular medial right clavicle fracture, with displacement and overriding as described. 2. Mild anterior superior T11 vertebral body compression fracture. 3. No pneumothorax.  No pleural effusions. 4. Minimal paraseptal emphysema at the lung apices. No acute cardiopulmonary disease. Electronically Signed   By: Delbert Phenix M.D.   On: 07/17/2016 15:07   Ct Cervical Spine Wo Contrast  Result Date: 07/17/2016 CLINICAL DATA:  Electrocution with fall from ladder EXAM: CT HEAD WITHOUT CONTRAST CT CERVICAL SPINE WITHOUT CONTRAST TECHNIQUE: Multidetector CT  imaging of the head and cervical spine was performed following the standard protocol without intravenous contrast. Multiplanar CT image reconstructions of the cervical spine were also generated. COMPARISON:  None. FINDINGS: CT HEAD FINDINGS Brain: The ventricles are normal in size and configuration. There is no intracranial mass hemorrhage, extra-axial fluid collection, or midline shift. Gray-white compartments are normal. There is no evident acute infarct. Vascular: No hyperdense vessel. Middle cerebral arteries show equivalent attenuation bilaterally. There are no foci of arterial vascular calcification. Skull: Bony calvarium appears intact. Sinuses/Orbits: There is mucosal thickening in  ethmoid air cells bilaterally anteriorly, full left normal height. There is mild mucosal thickening in the posterior right maxillary antrum. Other paranasal sinuses clear. Orbits appear symmetric bilaterally. Other: Mastoid air cells are clear. CT CERVICAL SPINE FINDINGS Alignment: There is no spondylolisthesis. Skull base and vertebrae: Skull base and craniocervical junction regions appear normal. There is no evident fracture. No blastic or lytic bone lesions. Soft tissues and spinal canal: Prevertebral soft tissues and predental space regions are normal. No spinal stenosis. No paraspinous lesions evident. Disc levels: There is slight disc space narrowing at C6-7. Other disc spaces appear normal. No nerve root edema or effacement. No disc extrusion or stenosis. Upper chest: No abnormality noted. Other: None IMPRESSION: CT head: No intracranial mass, hemorrhage, or extra-axial fluid collection. Gray-white compartments appear normal. No fractures evident. There are areas of paranasal sinus disease. CT cervical spine: No fracture or spondylolisthesis. Slight disc space narrowing C6-7. No prevertebral effaced. Disc extrusion or stenosis. Electronically Signed   By: Bretta Bang III M.D.   On: 07/17/2016 12:35   Dg Chest Portable 1 View  Result Date: 07/17/2016 CLINICAL DATA:  Trauma, right upper chest pain. EXAM: PORTABLE CHEST 1 VIEW COMPARISON:  None. FINDINGS: Heart and mediastinal contours are within normal limits. No focal opacities or effusions. No acute bony abnormality. No pneumothorax. No visible rib fracture. IMPRESSION: No active disease. Electronically Signed   By: Charlett Nose M.D.   On: 07/17/2016 11:28   Dg Humerus Right  Result Date: 07/17/2016 CLINICAL DATA:  Pain with movement of right arm after electrocution and fall from ladder. EXAM: RIGHT HUMERUS - 2+ VIEW COMPARISON:  None. FINDINGS: There is no evidence of fracture or other focal bone lesions. Soft tissues are unremarkable.  IMPRESSION: Negative. Electronically Signed   By: Kennith Center M.D.   On: 07/17/2016 13:10    ECG & Cardiac Imaging    Initial EKG - Afib RVR, rate 127 bpm 1100 EKG 10/9 1900 - NSR 80    Assessment & Plan  Principal Problem:   Electrocution Active Problems:   Atrial fibrillation, new onset (HCC)   Fall from ladder   Concussion   Right clavicle fracture       Electrocution  -- accidental electrocution episode.  This may be partly responsible for new onset atrial fibrillation, however cannot be certain.    A-fib (HCC) - new onset arrhythmia that spontaneously converted with vagal maneuver, he is currently in sinus rhythm.  He personally has no cardiac risk factors, therefore this would be considered [place]. Fib -- likely related to the stress of this episode and he may never have another episode.  Would recommend baby aspirin (81 mg) daily  Would prescribe when necessary diltiazem 60 mg 4 rapid heart rate symptoms.  This patients CHA2DS2-VASc Score and unadjusted Ischemic Stroke Rate (% per year) is equal to 0.2 % stroke rate/year from a score of 0  Above score calculated as  1 point each if present [CHF, HTN, DM, Vascular=MI/PAD/Aortic Plaque, Age if 82-74, or Male]; Above score calculated as 2 points each if present [Age > 75, or Stroke/TIA/TE]   He should be okay for discharge with medications noted above. We'll arrange follow-up in cardiology clinic.    Signed, Bryan Lemma, M.D., M.S. Interventional Cardiologist   Pager # [phone] Phone # [phone] [address] [place], [place] [address]  07/18/2016, 5:17 PM

## 2016-07-18 NOTE — Progress Notes (Signed)
Patient ID: Paul SewerRobert Ricciuti, male   DOB: 01/28/1986, 30 y.o.   MRN: [id number]   LOS: 1 day   Subjective: C/o right neck/shoulder pain, otherwise ok. Apparently converted to NSR after dry heaving yesterday.   Objective: Vital signs in last 24 hours: Temp:  [98 F (36.7 C)-98.2 F (36.8 C)] 98 F (36.7 C) (10/10 0800) Pulse Rate:  [64-159] 78 (10/10 0305) Resp:  [14-28] 26 (10/10 0305) BP: (115-161)/(74-116) 137/82 (10/10 0305) SpO2:  [90 %-99 %] 90 % (10/10 0305) Weight:  [81.6 kg (180 lb)] 81.6 kg (180 lb) (10/09 1940)    Laboratory  CBC  Recent Labs  07/17/16 1109 07/18/16 0402  WBC 7.7 10.4  HGB 18.2* 16.5  HCT 52.0 48.8  PLT 234 213   BMET  Recent Labs  07/17/16 1109 07/18/16 0402  NA 137 135  K 3.8 3.7  CL 104 102  CO2 23 24  GLUCOSE 99 102*  BUN 7 <5*  CREATININE 0.90 0.75  CALCIUM 8.9 8.7*    Physical Exam General appearance: alert and no distress Resp: clear to auscultation bilaterally Cardio: regular rate and rhythm GI: normal findings: bowel sounds normal and soft, non-tender Extremities: NVI   Assessment/Plan: Electrocution with fall  Concussion Right clavicle fx -- Dr. Carola FrostHandy to consult, non-operative Afib w/RVR -- awaiting cardiology to consult, back in NSR, will stop cardizem gtts FEN -- Orals for pain VTE -- SCD's, Lovenox Dispo -- PT/OT    Freeman CaldronMichael J. Merlon Alcorta, PA-C Pager: [phone] General Trauma PA Pager: [phone]  07/18/2016

## 2016-07-19 MED ORDER — TRAMADOL HCL 50 MG PO TABS: 100.0000 mg | ORAL_TABLET | Freq: Four times a day (QID) | ORAL | 0 refills | Status: DC

## 2016-07-19 MED ORDER — DILTIAZEM HCL 60 MG PO TABS: 60.0000 mg | ORAL_TABLET | Freq: Four times a day (QID) | ORAL | 0 refills | Status: DC | PRN

## 2016-07-19 MED ORDER — HYDROCODONE-ACETAMINOPHEN 10-325 MG PO TABS: 1.0000 | ORAL_TABLET | ORAL | 0 refills | Status: DC | PRN

## 2016-07-19 MED ORDER — TRAMADOL HCL 50 MG PO TABS
100.0000 mg | ORAL_TABLET | Freq: Four times a day (QID) | ORAL | Status: DC
  Administered 2016-07-19: 100 mg via ORAL
  Filled 2016-07-19: qty 2

## 2016-07-19 NOTE — Discharge Summary (Signed)
Physician Discharge Summary  Patient ID: Paul SewerRobert Clarke MRN: [id number] DOB/AGE: 31/11/1985 31 y.o.  Admit date: 07/17/2016 Discharge date: 07/19/2016  Discharge Diagnoses Patient Active Problem List   Diagnosis Date Noted  . Electrocution 07/18/2016  . Fall from ladder 07/18/2016  . Concussion 07/18/2016  . Right clavicle fracture 07/18/2016  . Atrial fibrillation, new onset (HCC) 07/17/2016    Consultants Dr. Bryan Lemmaavid Harding for cardiology  Dr. Myrene GalasMichael Handy for orthopedic surgery  Dr. Sharlet SalinaBenjamin Ditty for neurology (by telephone)   Procedures None   HPI: Paul MaduroRobert was about 6 feet up a ladder when he grabbed hold of a live 277V line with his right hand. This threw him off the ladder and he landed hitting the back of his head and right shoulder. There was a loss of consciousness for 15-20s. The patient was amnestic for much longer than that. He was brought in as a level 2 trauma activation. He was found to have a clavicle fracture as well as atrial fibrillation with rapid ventricular response and elevated blood pressures. He was started on a Cardizem drip. Cardiology and orthopedic surgery were consulted and he was admitted to the trauma service.   Hospital Course: Later on the day of admission the patient vomited and spontaneously converted to a normal sinus rhythm. Cardiology recommended treatment with oral Cardizem for tachycardia though they felt this problem would not likely recur. Orthopedic surgery recommended non-operative treatment in a sling. His CT scan showed a mild compression fracture in his thoracic spine that wasn't tender on admission. It was hurting the following day however and neurosurgery was consulted. They recommended a brace for comfort. He was mobilized with physical and occupational therapies and did well. His pain was controlled on a mixture of oral medications and he was discharged home in good condition.     Medication List    TAKE these medications    diltiazem 60 MG tablet Commonly known as:  CARDIZEM Take 1 tablet (60 mg total) by mouth 4 (four) times daily as needed (High heart rate).   HYDROcodone-acetaminophen 10-325 MG tablet Commonly known as:  NORCO Take 1-2 tablets by mouth every 4 (four) hours as needed (1/2 tablet for mild pain, 1 tablet for moderate pain, 2 tablets for severe pain).   traMADol 50 MG tablet Commonly known as:  ULTRAM Take 2 tablets (100 mg total) by mouth every 6 (six) hours.       Follow-up Information    HANDY,Erick Oxendine H, MD. Schedule an appointment as soon as possible for a visit in 2 week(s).   Specialty:  Orthopedic Surgery Contact information: [address] [place] [address] [phone]        Bryan Lemmaavid Harding, MD. Schedule an appointment as soon as possible for a visit today.   Specialty:  Cardiology Contact information: [address] [place] [address] [phone]        Loura HaltBenjamin Jared Ditty, MD. Schedule an appointment as soon as possible for a visit today.   Specialty:  Neurosurgery Contact information: [address] [place] [address] [phone]        [facility] .   Why:  Call as needed Contact information: [address] [id number] [place] [id number] [phone]           Signed: Freeman CaldronMichael J. Salma Walrond, PA-C Pager: [phone] General Trauma PA Pager: [phone] 07/19/2016, 2:21 PM

## 2016-07-19 NOTE — Progress Notes (Signed)
Spoke with Marylene Landngela, Workers' Comp adjustor for [employer], pt's [facility] carrier.  Faxed clinical information to [facility] at [phone].    Carrier: [employer] Phone: [phone] Fax: [phone] Mailing Address:  [address]                              [place], [place]  [id number]  Will assure admitting office receives [facility] information to update in EPIC.    Quintella BatonJulie W. Tillman Kazmierski, RN, BSN  Trauma/Neuro ICU Case Manager [phone]

## 2016-07-19 NOTE — Discharge Instructions (Signed)
Orthopaedic discharge instructions              Sling for comfort  Ice as needed for swelling and pain control  Range of motion as tolerated  Can use R arm as pain allows   Call [phone] with questions or concerns   Use brace for comfort as well.  No driving while taking hydrocodone.

## 2016-07-19 NOTE — Progress Notes (Signed)
Patient ID: Paul SewerRobert Clarke, male   DOB: 11/18/1985, 30 y.o.   MRN: [id number]   LOS: 2 days   Subjective: Doing well except pain still significant, having to use frequent IV Dilaudid.   Objective: Vital signs in last 24 hours: Temp:  [97.8 F (36.6 C)-98.3 F (36.8 C)] 98.1 F (36.7 C) (10/10 2040) Pulse Rate:  [60-74] 74 (10/10 2040) Resp:  [14-25] 19 (10/10 2040) BP: (121-162)/(90-94) 136/90 (10/10 2040) SpO2:  [99 %] 99 % (10/10 2040) Weight:  [79.4 kg (175 lb)] 79.4 kg (175 lb) (10/10 2040)    Physical Exam General appearance: alert and no distress Resp: clear to auscultation bilaterally Cardio: regular rate and rhythm GI: normal findings: bowel sounds normal and soft, non-tender   Assessment/Plan: Electrocution with fall  Concussion Right clavicle fx -- per Dr. Carola FrostHandy, non-operative Afib w/RVR -- appreciate cardiology consult, back in NSR, PO cardizem prn tachycardia FEN -- Add tramadol VTE -- SCD's, Lovenox Dispo -- PT/OT, likely home this afternoon    Freeman CaldronMichael J. Candido Flott, PA-C Pager: [phone] General Trauma PA Pager: [phone]  07/19/2016

## 2016-07-19 NOTE — Progress Notes (Signed)
OT screen Note  Patient Details Name: Paul SewerRobert Moan MRN: [id number] DOB: 04/12/1986         PT EVALUATION / OT SIGN-OFF  OT order received and patient screened with no acute OT needs at this time. If acute OT needs arise, OT will evaluate. Please refer to discharge planning section for recommendations for equipment. Thank you  Pt advised to avoid bending lifting twisting for back safety and don of TLSO. Pt currently does not require (A). Pt can reach BIL FEET, head and demonstrates mod I bed mobility     Felecia ShellingJones, [name]   [name], [name]   OTR/L Pager: [phone] Office: [phone] .  07/19/2016, 2:23 PM

## 2016-07-19 NOTE — Evaluation (Signed)
Physical Therapy Evaluation Patient Details Name: Paul Clarke MRN: [id number] DOB: 06-06-1986 Today's Date: 07/19/2016   History of Present Illness  Pt is a 30 y/o male admitted after being electrocuted accidentally at work. Pt also sustained a fall from approximately 6 ft high which left him with a fx'd R clavicle and compression fx at T11. No pertinent PMH.  Clinical Impression  Pt presented supine in bed with HOB elevated, awake and willing to participate in therapy session. Prior to admission, pt was independent with all functional mobility. Pt moving well during evaluation and was able to don TLSO in sitting with min A. He did not require any physical assist with other functional mobility. Pt would continue to benefit from skilled physical therapy services at this time while admitted to address his below listed limitations in order to improve his overall safety and independence with functional mobility.      Follow Up Recommendations Supervision - Intermittent    Equipment Recommendations  None recommended by PT    Recommendations for Other Services       Precautions / Restrictions Precautions Required Braces or Orthoses: Spinal Brace;Sling Spinal Brace: Thoracolumbosacral orthotic;Applied in sitting position (for comfort) Restrictions Weight Bearing Restrictions: Yes RUE Weight Bearing: Non weight bearing (allowed to perform AROM and use to tolerance)      Mobility  Bed Mobility Overal bed mobility: Modified Independent             General bed mobility comments: pt required increased time and HOB elevated  Transfers Overall transfer level: Needs assistance Equipment used: None Transfers: Sit to/from Stand Sit to Stand: Supervision         General transfer comment: pt required increased time  Ambulation/Gait Ambulation/Gait assistance: Supervision Ambulation Distance (Feet): 200 Feet Assistive device: None Gait Pattern/deviations: Step-through  pattern;Decreased stride length Gait velocity: decreased Gait velocity interpretation: Below normal speed for age/gender General Gait Details: pt able to participate in higher level balance testing (see below)  Stairs            Wheelchair Mobility    Modified Rankin (Stroke Patients Only)       Balance Overall balance assessment: Needs assistance Sitting-balance support: Feet supported;No upper extremity supported Sitting balance-Leahy Scale: Fair Sitting balance - Comments: pt required min A to don TLSO in sitting   Standing balance support: During functional activity;No upper extremity supported Standing balance-Leahy Scale: Fair                   Standardized Balance Assessment Standardized Balance Assessment : Dynamic Gait Index   Dynamic Gait Index Level Surface: Normal Change in Gait Speed: Normal Gait with Horizontal Head Turns: Normal Gait with Vertical Head Turns: Normal Gait and Pivot Turn: Normal Step Over Obstacle: Normal Step Around Obstacles: Normal Steps: Mild Impairment Total Score: 23       Pertinent Vitals/Pain Pain Assessment: 0-10 Pain Score: 5  Pain Location: R clavicle and shoulder Pain Descriptors / Indicators: Sore;Guarding Pain Intervention(s): Monitored during session;Repositioned    Home Living Family/patient expects to be discharged to:: Private residence Living Arrangements: Spouse/significant other Available Help at Discharge: Family;Available PRN/intermittently Type of Home: House Home Access: Stairs to enter Entrance Stairs-Rails: Right;Left;Can reach both Entrance Stairs-Number of Steps: 5 Home Layout: One level Home Equipment: None      Prior Function Level of Independence: Independent         Comments: pt works as an Barista   Dominant Hand: Right  Extremity/Trunk Assessment   Upper Extremity Assessment: Defer to OT evaluation           Lower Extremity Assessment:  Overall WFL for tasks assessed      Cervical / Trunk Assessment: Normal  Communication   Communication: No difficulties  Cognition Arousal/Alertness: Awake/alert Behavior During Therapy: WFL for tasks assessed/performed Overall Cognitive Status: Within Functional Limits for tasks assessed                      General Comments      Exercises     Assessment/Plan    PT Assessment Patient needs continued PT services  PT Problem List Decreased activity tolerance;Decreased mobility;Pain          PT Treatment Interventions Gait training;Therapeutic activities;Therapeutic exercise;Balance training;Neuromuscular re-education;Patient/family education    PT Goals (Current goals can be found in the Care Plan section)  Acute Rehab PT Goals Patient Stated Goal: return home today PT Goal Formulation: With patient Time For Goal Achievement: 07/26/16 Potential to Achieve Goals: Good    Frequency Min 3X/week   Barriers to discharge        Co-evaluation               End of Session Equipment Utilized During Treatment: Gait belt;Back brace Activity Tolerance: Patient limited by pain Patient left: in bed;with call bell/phone within reach;with family/visitor present Nurse Communication: Mobility status         Time: [id number] PT Time Calculation (min) (ACUTE ONLY): 18 min   Charges:   PT Evaluation $PT Eval Moderate Complexity: 1 Procedure     PT G CodesAlessandra Bevels:        Shaylah Mcghie M Rosangelica Pevehouse 07/19/2016, 1:45 PM Deborah ChalkJennifer Briar Sword, PT, DPT [phone]

## 2016-07-19 NOTE — Progress Notes (Signed)
Pt discharged to home.  Discharge instructions explained to pt. Pt has no questions at the time of discharge.  IV removed.  Pt states he has all belongings.  Pt taken off unit via wheelchair by staff. 

## 2016-07-19 NOTE — Progress Notes (Signed)
   Pt doing well this AM - pain difficult to control. Had some S Tachy on Tele, but no further Afib.  D/w Trauma team - plan is d/c today.  See C/S Note for Recs:  Electrocution  -- accidental electrocution episode.  This may be partly responsible for new onset atrial fibrillation, however cannot be certain.    A-fib (HCC) - new onset arrhythmia that spontaneously converted with vagal maneuver, he is currently in sinus rhythm.  He personally has no cardiac risk factors, therefore this would be considered [place]. Fib -- likely related to the stress of this episode and he may never have another episode.  Would recommend baby aspirin (81 mg) daily  Would prescribe when necessary diltiazem 60 mg 4 rapid heart rate symptoms.  This patients CHA2DS2-VASc Score and unadjusted Ischemic Stroke Rate (% per year) is equal to 0.2 % stroke rate/year from a score of 0  Above score calculated as 1 point each if present [CHF, HTN, DM, Vascular=MI/PAD/Aortic Plaque, Age if 6865-74, or Male]; Above score calculated as 2 points each if present [Age > 75, or Stroke/TIA/TE]   He should be okay for discharge with medications noted above. We'll arrange follow-up in cardiology clinic.  Bryan Lemmaavid Addasyn Mcbreen, M.D., M.S. Interventional Cardiologist   Pager # [phone] Phone # [phone] [address] [place], [place] [address]

## 2016-07-20 ENCOUNTER — Encounter: Payer: Self-pay | Admitting: Internal Medicine

## 2016-08-02 NOTE — Progress Notes (Deleted)
PCP: [name] Patient  Clinic Note: No chief complaint on file.   HPI: Paul Clarke is a 30 y.o. male with a PMH below who presents today for hospital follow-up for post electrocution/trauma atrial fibrillation.. He has a significant family history of premature CAD in his mother and father as well as maternal grandfather and uncles.  Paul Clarke was last seen on 07/18/2016 in consultation while in the hospital following a traumatic fall from a ladder after being electrocuted. Paul Clarke was on a 6 foot ladder working on the lateral system in the ceiling church. He apparently accidentally grabbed hold of a live 277 V cable with his right hand. He then was thrown from ladder landing on on his back and right shoulder. He had roughly half a minute or loss of consciousness and was amnestic of the entire episode. While in the emergency room, he was noted to be in A. fib RVR that he began feeling shortly after arrival. He was treated with IV diltiazem initially and then converted spontaneously to sinus rhythm after an episode of nausea and near emesis. He remained in sinus rhythm the rest the hospitalization.  -- This was considered to be lone atrial fibrillation as he had no other risk factors. The plan was for him to discharged taking a daily aspirin 81 mg along with as needed diltiazem 60 mg to be used for rapid heart rates. CHA2DS2-VASc Score and unadjusted Ischemic Stroke Rate (% per year) is equal to 0.2 % stroke rate/year from a score of 0  Studies Reviewed: none  Interval History: ***   No chest pain or shortness of breath with rest or exertion. No PND, orthopnea or edema. No palpitations, lightheadedness, dizziness, weakness or syncope/near syncope. No TIA/amaurosis fugax symptoms. No melena, hematochezia, hematuria, or epstaxis. No claudication.   ROS: A comprehensive was performed. ROS   No past medical history on file.  No past surgical history on  file.  @hmed @  Allergies  Allergen Reactions  . Morphine And Related Other (See Comments)    07/18/16:  a rash that is "not serious", tolerates Hydromorphone    Social History   Social History  . Marital status: Single    Spouse name: N/A  . Number of children: N/A  . Years of education: N/A   Social History Main Topics  . Smoking status: Current Some Day Smoker  . Smokeless tobacco: Never Used  . Alcohol use Yes     Comment: occasional  . Drug use: No  . Sexual activity: Not on file   Other Topics Concern  . Not on file   Social History Narrative   ** Merged History Encounter **        Family History  Problem Relation Age of Onset  . Coronary artery disease Mother 37  . Coronary artery disease Father 70    Status post CABG  . Coronary artery disease Paternal Grandfather   . Coronary artery disease Paternal Uncle     In his 13s  . Stroke Paternal Uncle     Wt Readings from Last 3 Encounters:  07/18/16 79.4 kg (175 lb)  10/14/15 83 kg (183 lb)  09/07/15 84.4 kg (186 lb)    PHYSICAL EXAM There were no vitals taken for this visit. General appearance: alert, cooperative, appears stated age, no distress and *** obese Neck: no adenopathy, no carotid bruit and no JVD Lungs: clear to auscultation bilaterally, normal percussion bilaterally and non-labored Heart: regular rate and rhythm, S1, S2  normal, no murmur, click, rub or gallop *** Abdomen: soft, non-tender; bowel sounds normal; no masses,  no organomegaly; *** Extremities: extremities normal, atraumatic, no cyanosis, and edema *** Pulses: 2+ and symmetric; *** Skin: {normal findings:33173} or {abnormal findings:33192} Neurologic: Mental status: Alert, oriented, thought content appropriate Cranial nerves: normal (II-XII grossly intact)    Adult ECG Report  Rate: *** ;  Rhythm: {rhythm:17366};   Narrative Interpretation: ***   Other studies Reviewed: Additional studies/ records that were reviewed  today include:  Recent Labs:  ***     ASSESSMENT / PLAN: Problem List Items Addressed This Visit    None    Visit Diagnoses   None.     Current medicines are reviewed at length with the patient today. (+/- concerns) *** The following changes have been made: ***  There are no Patient Instructions on file for this visit.  Studies Ordered:   No orders of the defined types were placed in this encounter.     Bryan Lemmaavid Lorijean Husser, M.D., M.S. Interventional Cardiologist   Pager # [phone] Phone # [phone] [address] [place], [place] [address]

## 2016-08-03 ENCOUNTER — Ambulatory Visit: Payer: Self-pay | Admitting: Cardiology

## 2016-08-07 ENCOUNTER — Other Ambulatory Visit: Payer: Self-pay | Admitting: Orthopedic Surgery

## 2016-08-07 DIAGNOSIS — M25511 Pain in right shoulder: Secondary | ICD-10-CM

## 2016-08-11 ENCOUNTER — Other Ambulatory Visit: Payer: Self-pay | Admitting: Orthopedic Surgery

## 2016-08-11 DIAGNOSIS — M25511 Pain in right shoulder: Secondary | ICD-10-CM

## 2016-08-17 ENCOUNTER — Ambulatory Visit (INDEPENDENT_AMBULATORY_CARE_PROVIDER_SITE_OTHER): Payer: Worker's Compensation | Admitting: Cardiology

## 2016-08-17 ENCOUNTER — Encounter: Payer: Self-pay | Admitting: Cardiology

## 2016-08-17 VITALS — BP 106/88 | HR 97 | Ht 67.0 in | Wt 174.0 lb

## 2016-08-17 DIAGNOSIS — T754XXA Electrocution, initial encounter: Secondary | ICD-10-CM

## 2016-08-17 DIAGNOSIS — I480 Paroxysmal atrial fibrillation: Secondary | ICD-10-CM

## 2016-08-17 MED ORDER — DILTIAZEM HCL ER COATED BEADS 120 MG PO CP24: 120.0000 mg | ORAL_CAPSULE | Freq: Every day | ORAL | 3 refills | Status: DC

## 2016-08-17 NOTE — Progress Notes (Signed)
PCP: [name] Patient  Clinic Note: Chief Complaint  Patient presents with  . Follow-up    New patient, post hospital with an new onset of afib.   . Dizziness    When having  an onset of afib.  Marland Kitchen Shortness of Breath    When havng an onset of afib    HPI: Paul Clarke is a 30 y.o. male with a PMH below who presents today for Hospital follow-up after an episode of A. fib following electrocution. He has a pretty significant family history of coronary disease with his mother father and maternal grandfather as well as several uncles.  Paul Clarke was last seen in consultation while in the hospital. 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 Broke his right clavicle. While in the emergency room he started having some dyspnea and dizziness was found to be in A. fib with RVR. This was treated with an IV dose of diltiazem and it spontaneously resolved converting to sinus rhythm. He had no further A. fib episodes.  Recent Hospitalizations: October 9-11, 2017 - after falling from a ladder post electrocution  Studies Reviewed: None  Interval History: Paul Clarke presents today for follow-up for the most part doing well, but he has noted several episodes of maybe 15-20 minutes no irregular heart palpitations since his discharge. He has had 6 since his discharge, but none in the last 2 weeks. When he has these episodes he feels his heart fluttering and feels a sense of tensing up and having diffuse chills. He gets very anxious with it and will try to then lie down and take deep breaths to the symptoms go away. Usually within 1520 minutes of taking his diltiazem symptom resolves. He feels a little dizzy and lightheaded, but does not know that he feels like he may be passing out. He has not really been doing  much of anything since his hospitalization, but has noted a little bit of irregularity in his heart He has not had any chest tightness or pressure with rest or exertion nor any PND, orthopnea or edema.   ROS: A comprehensive was performed. Review of Systems  Constitutional: Positive for chills (When he feels his palpitations).  HENT: Negative.   Eyes: Negative.   Respiratory: Negative for cough, shortness of breath and wheezing.   Cardiovascular:       Per history of present illness  Gastrointestinal: Negative.  Negative for blood in stool, constipation, diarrhea, heartburn and melena.  Genitourinary: Negative.   Musculoskeletal:       His right shoulder and right-sided rib cage is still extremely sore. Still has his arm in a sling.  Neurological: Positive for dizziness (Only during his palpitation episodes).  Endo/Heme/Allergies: Negative.  Negative for environmental allergies.  Psychiatric/Behavioral: Negative for memory loss. The patient is nervous/anxious. The patient does not have insomnia.        He has a history of PTSD and anxiety. Clearly probably has PTSD symptoms from this most recent episode.  All other systems reviewed and are negative.   Past Medical History:  Diagnosis Date  . Closed right clavicular fracture 07/2016   After falling from 6 foot ladder in the sequela of the electrocuted   . Episodic atrial fibrillation (HCC) 07/2016   Following electrocution  .  History of posttraumatic stress disorder (PTSD)     History reviewed. No pertinent surgical history.  Prior to Admission medications   Medication Sig Start Date End Date Taking? Authorizing Provider  diltiazem (CARDIZEM) 60 MG tablet Take 1 tablet (60 mg total) by mouth 4 (four) times daily as needed (High heart rate). 07/19/16 07/19/17 Yes Freeman CaldronMichael J Jeffery, PA-C  HYDROcodone-acetaminophen (NORCO) 10-325 MG tablet Take 1-2 tablets by mouth every 4 (four) hours as needed (1/2 tablet for mild pain, 1 tablet  for moderate pain, 2 tablets for severe pain). 07/19/16  Yes Freeman CaldronMichael J Jeffery, PA-C    Allergies  Allergen Reactions  . Morphine And Related Other (See Comments)    07/18/16:  a rash that is "not serious", tolerates Hydromorphone    Social History   Social History  . Marital status: Single    Spouse name: N/A  . Number of children: N/A  . Years of education: N/A   Social History Main Topics  . Smoking status: Current Some Day Smoker  . Smokeless tobacco: Never Used  . Alcohol use Yes     Comment: occasional  . Drug use: No  . Sexual activity: Not Asked   Other Topics Concern  . None   Social History Narrative   ** Merged History Encounter **       Family History  Problem Relation Age of Onset  . Coronary artery disease Mother [id number]  . Coronary artery disease Father 8550    Status post CABG  . Coronary artery disease Paternal Grandfather   . Coronary artery disease Paternal Uncle     In his 7450s  . Stroke Paternal Uncle     Wt Readings from Last 3 Encounters:  08/17/16 78.9 kg (174 lb)  07/18/16 79.4 kg (175 lb)  10/14/15 83 kg (183 lb)    PHYSICAL EXAM BP 106/88   Pulse 97   Ht 5\' 7"  (1.702 m)   Wt 78.9 kg (174 lb)   BMI 27.25 kg/m  General appearance: alert, cooperative, appears stated age, no distress and Well-nourished, well-groomed. Healthy-appearing. Right arm is Neck: no adenopathy, no carotid bruit and no JVD Lungs: clear to auscultation bilaterally, normal percussion bilaterally and non-labored Heart: regular rate and rhythm, S1& S2 normal, no murmur, click, rub or gallop; nondisplaced PMI Abdomen: soft, non-tender; bowel sounds normal; no masses,  no organomegaly; no HJR Extremities: extremities normal, atraumatic, no cyanosis, or edema Pulses: 2+ and symmetric;  Neurologic: Mental status: Alert, oriented, thought content appropriate   Adult ECG Report  Rate: 97 ;  Rhythm: normal sinus rhythm and Normalizes, intervals and durations. Mild diffuse  ST elevation consistent with early repolarization;   Narrative Interpretation: Essentially normal EKG   Other studies Reviewed: Additional studies/ records that were reviewed today include:  Recent Labs:  n/a   ASSESSMENT / PLAN: I'm hoping that the father away from his event may get, his A. fib will start to fade away. At present we are going to treat with rate control agents, given the now reduced frequency of spells. These episodes should not limit him in a work situation as long as he has prodromal symptoms while to get himself out of an unsafe situation.  Problem List Items Addressed This Visit    Episodic atrial fibrillation (HCC) - Primary (Chronic)    Kennon RoundsSally he's been having a few more episodes since his discharge. Thankfully, he had the diltiazem already prescribed. Sounds like the episodes are reducing infrequency, and I'm not sure if  he will have any more. If he were to have a recurrent spell, I would like to have him actually wearing 30 day monitor to confirm or deny it is A. fib versus another arrhythmia. Also to allow the duration of episodes.  Plan for now is to convert from when necessary diltiazem to standing 120 mg Cartia XT. He can continue to use additional dose when necessary.  We will check 2-D echocardiogram to ensure that there is no structural damage to the heart from his electrocution. This will also allow us to know if we can potentially use antiarrhythmic agent if necessary.      Relevant Medications   diltiazem (CARDIZEM CD) 120 MG 24 hr capsule   Other Relevant Orders   EKG 12-Lead   ECHOCARDIOGRAM COMPLETE   Electrocution    Need to confirm that there was no damage done to the heart from electrocution. Back there was an arrhythmia would suggest that there may have been some Plan: Check. 2-D echocardiogram      Relevant Orders   EKG 12-Lead   ECHOCARDIOGRAM COMPLETE      Current medicines are reviewed at length with the patient today. (+/- concerns)  None The following changes have been made: See below  Patient Instructions  Schedule at [facility] Your physician has requested that you have an echocardiogram. Echocardiography is a painless test that uses sound waves to create images of your heart. It provides your doctor with information about the size and shape of your heart and how well your heart's chambers and valves are working. This procedure takes approximately one hour. There are no restrictions for this procedure.   START MEDICATION DILTAZEM CR 120 MG BY MOUTH AT BEDTIME.  USE DILTAZEM 60 MG AS NEEDED,   IF you have another spell as describe to Dr Oda CoganHarding,Call office to let us know - will probably order 30 day event monitor for you to wear.  Your physician recommends that you schedule a follow-up appointment in: FEB. 2018 with Dr Herbie BaltimoreHarding- 30 min  If you need a refill on your cardiac medications before your next appointment, please call your pharmacy.    Studies Ordered:   Orders Placed This Encounter  Procedures  . EKG 12-Lead  . ECHOCARDIOGRAM COMPLETE      Bryan Lemmaavid Harding, M.D., M.S. Interventional Cardiologist   Pager # [phone] Phone # [phone] [address] [place], [place] [address]

## 2016-08-17 NOTE — Assessment & Plan Note (Signed)
Need to confirm that there was no damage done to the heart from electrocution. Back there was an arrhythmia would suggest that there may have been some Plan: Check. 2-D echocardiogram

## 2016-08-17 NOTE — Assessment & Plan Note (Signed)
Kennon RoundsSally he's been having a few more episodes since his discharge. Thankfully, he had the diltiazem already prescribed. Sounds like the episodes are reducing infrequency, and I'm not sure if he will have any more. If he were to have a recurrent spell, I would like to have him actually wearing 30 day monitor to confirm or deny it is A. fib versus another arrhythmia. Also to allow the duration of episodes.  Plan for now is to convert from when necessary diltiazem to standing 120 mg Cartia XT. He can continue to use additional dose when necessary.  We will check 2-D echocardiogram to ensure that there is no structural damage to the heart from his electrocution. This will also allow us to know if we can potentially use antiarrhythmic agent if necessary.

## 2016-08-17 NOTE — Patient Instructions (Addendum)
Schedule at [facility] Your physician has requested that you have an echocardiogram. Echocardiography is a painless test that uses sound waves to create images of your heart. It provides your doctor with information about the size and shape of your heart and how well your heart's chambers and valves are working. This procedure takes approximately one hour. There are no restrictions for this procedure.   START MEDICATION DILTAZEM CR 120 MG BY MOUTH AT BEDTIME.  USE DILTAZEM 60 MG AS NEEDED,   IF you have another spell as describe to Dr Oda CoganHarding,Call office to let us know - will probably order 30 day event monitor for you to wear.  Your physician recommends that you schedule a follow-up appointment in: FEB. 2018 with Dr Herbie BaltimoreHarding- 30 min  If you need a refill on your cardiac medications before your next appointment, please call your pharmacy.

## 2016-08-25 ENCOUNTER — Ambulatory Visit
Admission: RE | Admit: 2016-08-25 | Discharge: 2016-08-25 | Disposition: A | Discharge status: Home / Self Care | Payer: Worker's Compensation | Source: Ambulatory Visit | Attending: Orthopedic Surgery | Admitting: Orthopedic Surgery

## 2016-08-25 ENCOUNTER — Other Ambulatory Visit: Payer: Self-pay | Admitting: Orthopedic Surgery

## 2016-08-25 DIAGNOSIS — Z0189 Encounter for other specified special examinations: Secondary | ICD-10-CM

## 2016-08-25 DIAGNOSIS — M25511 Pain in right shoulder: Secondary | ICD-10-CM

## 2016-08-25 DIAGNOSIS — Z1389 Encounter for screening for other disorder: Secondary | ICD-10-CM

## 2016-08-25 MED ORDER — IOPAMIDOL (ISOVUE-M 200) INJECTION 41%
12.0000 mL | Freq: Once | INTRAMUSCULAR | Status: AC
  Administered 2016-08-25: 12 mL via INTRA_ARTICULAR

## 2016-08-30 ENCOUNTER — Telehealth: Payer: Self-pay | Admitting: Cardiology

## 2016-08-30 NOTE — Telephone Encounter (Signed)
She needs the doctor's notes from 08-17-16.     Did not need this encounter

## 2016-09-13 ENCOUNTER — Other Ambulatory Visit (HOSPITAL_COMMUNITY): Payer: Self-pay

## 2016-10-18 ENCOUNTER — Other Ambulatory Visit (HOSPITAL_COMMUNITY): Payer: Worker's Compensation

## 2016-11-01 ENCOUNTER — Ambulatory Visit (HOSPITAL_COMMUNITY): Payer: Worker's Compensation | Attending: Cardiology

## 2016-11-01 ENCOUNTER — Other Ambulatory Visit: Payer: Self-pay

## 2016-11-01 DIAGNOSIS — X58XXXA Exposure to other specified factors, initial encounter: Secondary | ICD-10-CM | POA: Insufficient documentation

## 2016-11-01 DIAGNOSIS — T754XXA Electrocution, initial encounter: Secondary | ICD-10-CM | POA: Diagnosis not present

## 2016-11-01 DIAGNOSIS — Z8249 Family history of ischemic heart disease and other diseases of the circulatory system: Secondary | ICD-10-CM | POA: Insufficient documentation

## 2016-11-01 DIAGNOSIS — I480 Paroxysmal atrial fibrillation: Secondary | ICD-10-CM | POA: Diagnosis not present

## 2016-11-06 ENCOUNTER — Telehealth: Payer: Self-pay | Admitting: *Deleted

## 2016-11-06 NOTE — Telephone Encounter (Signed)
LEFT MESSAGE TO CALL BACK IN REGARDS TO ECHO RESULTS 

## 2016-11-06 NOTE — Telephone Encounter (Signed)
-----   Message from Marykay Lexavid W Harding, MD sent at 11/03/2016  4:48 PM EST ----- Essentially normal echocardiogram. EF on the low normal range but still normal 50-55%.  Otherwise normal.

## 2016-11-09 HISTORY — PX: TRANSTHORACIC ECHOCARDIOGRAM: SHX275

## 2016-11-09 NOTE — Telephone Encounter (Signed)
Left message to call back  

## 2016-11-13 ENCOUNTER — Encounter: Payer: Self-pay | Admitting: *Deleted

## 2016-11-13 NOTE — Telephone Encounter (Signed)
MAIL ECHO RESULT LETTER

## 2016-11-22 ENCOUNTER — Encounter: Payer: Self-pay | Admitting: Cardiology

## 2016-11-22 ENCOUNTER — Ambulatory Visit (INDEPENDENT_AMBULATORY_CARE_PROVIDER_SITE_OTHER): Payer: Worker's Compensation | Admitting: Cardiology

## 2016-11-22 VITALS — BP 136/92 | HR 94 | Ht 67.0 in | Wt 172.0 lb

## 2016-11-22 DIAGNOSIS — T754XXA Electrocution, initial encounter: Secondary | ICD-10-CM

## 2016-11-22 DIAGNOSIS — T754XXD Electrocution, subsequent encounter: Secondary | ICD-10-CM

## 2016-11-22 DIAGNOSIS — I480 Paroxysmal atrial fibrillation: Secondary | ICD-10-CM | POA: Diagnosis not present

## 2016-11-22 NOTE — Patient Instructions (Signed)
CHANGE  WITH MEDICATIONS  CONTINUE WITH DILTIAZEM UNTIL YOU HAVE SURGERY- AFTER SURGERY   IF YOU WILL LIKE TO TRY AN STOP DILTIAZEM   TAKE DILTIAZEM EVERY OTHER DAY  FOR A WEEK , THEN STOP MEDICATIONS IF YOU HAVE TO USE AS NEEDED DILTIAZEM (PRN)  - GO BACK TO USING DAILY DILTIAZEM.     Your physician wants you to follow-up in SEPT 2018 WITH DR HARDING.You will receive a reminder letter in the mail two months in advance. If you don't receive a letter, please call our office to schedule the follow-up appointment.   If you need a refill on your cardiac medications before your next appointment, please call your pharmacy.

## 2016-11-22 NOTE — Progress Notes (Signed)
PCP: [name] Patient  Clinic Note: Chief Complaint  Patient presents with  . Follow-up    F/U after echo.  . Atrial Fibrillation    HPI: Paul Clarke is a 31 y.o. male with a PMH below who presents today for 3 month f/u for his Afib triggered by electrocution.  .  He has a pretty significant family history of coronary disease with his mother father and maternal grandfather as well as several uncles.  Paul Clarke was last seen in consultation while in the hospital. 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 Broke his right clavicle. While in the emergency room he started having some dyspnea and dizziness was found to be in A. fib with RVR. This was treated with an IV dose of diltiazem and it spontaneously resolved converting to sinus rhythm. He had no further A. fib episodes.  Paul Clarke was last seen in Nov 2017 - noted several 10-15 min episodes of irregular heartbeats,but they seemed to be happening less frequently.   Recent Hospitalizations: n/a  Studies Reviewed:   2-D echocardiogram 11/03/2016: EF 50-55%. Otherwise normal.  No obvious valvular disease.  Interval History: Paul Clarke presents today now feeling quite well. He has not had any further episodes of palpitations since I last saw him. He is not sure if it's a new medication or what, but he is not had used any when necessary diltiazem. In the 1 sacroiliac cocaine he felt some short flutter sensation but only lasted a few seconds. He has not had any other cardiac symptoms to speak of now. He is simply try to await resolution of his clavicle fracture/injury. He may have to go for surgery. Until then he cannot go back to work.  Cardiac Review of Symptoms: No chest pain or shortness of breath with rest or exertion. No PND,  orthopnea or edema. No palpitations, lightheadedness, dizziness, weakness or syncope/near syncope. No TIA/amaurosis fugax symptoms. No melena, hematochezia, hematuria, or epstaxis. No claudication.  ROS: A comprehensive was performed. Review of Systems  Constitutional: Negative for malaise/fatigue.  Psychiatric/Behavioral: The patient is nervous/anxious (He still gets a little bit anxious thinking about his accident).   All other systems reviewed and are negative.   Past Medical History:  Diagnosis Date  . Closed right clavicular fracture 07/2016   After falling from 6 foot ladder in the sequela of the electrocuted   . Episodic atrial fibrillation (HCC) 07/2016   Following electrocution  . History of posttraumatic stress disorder (PTSD)     History reviewed. No pertinent surgical history.  Current Meds  Medication Sig  . diltiazem (CARDIZEM) 60 MG tablet Take 1 tablet (60 mg total) by mouth 4 (four) times daily as needed (High heart rate).  . pregabalin (LYRICA) 75 MG capsule Take 75 mg by mouth 2 (two) times daily.    Allergies  Allergen Reactions  . Morphine And Related Other (See Comments)    07/18/16:  a rash that is "not serious", tolerates Hydromorphone    Social History   Social History  . Marital status: Single    Spouse name: N/A  . Number of children: N/A  . Years of education: N/A   Social History Main Topics  . Smoking status: Current Some Day Smoker  . Smokeless tobacco:  Never Used  . Alcohol use Yes     Comment: occasional  . Drug use: No  . Sexual activity: Not Asked   Other Topics Concern  . None   Social History Narrative   ** Merged History Encounter **        family history includes Coronary artery disease in his paternal grandfather and paternal uncle; Coronary artery disease (age of onset: 9750) in his father; Coronary artery disease (age of onset: 5655) in his mother; Stroke in his paternal uncle.  Wt Readings from Last 3 Encounters:    11/22/16 78 kg (172 lb)  08/17/16 78.9 kg (174 lb)  07/18/16 79.4 kg (175 lb)    PHYSICAL EXAM BP (!) 136/92   Pulse 94   Ht 5\' 7"  (1.702 m)   Wt 78 kg (172 lb)   BMI 26.94 kg/m  General appearance: alert, cooperative, appears stated age, no distress and Well-nourished, well-groomed. Healthy-appearing. Right arm is Neck: no adenopathy, no carotid bruit and no JVD Lungs: clear to auscultation bilaterally, normal percussion bilaterally and non-labored Heart: regular rate and rhythm, S1& S2 normal, no murmur, click, rub or gallop; nondisplaced PMI Abdomen: soft, non-tender; bowel sounds normal; no masses,  no organomegaly; no HJR Extremities: extremities normal, atraumatic, no cyanosis, or edema Pulses: 2+ and symmetric;  Neurologic: Mental status: Alert, oriented, thought content appropriate    Adult ECG Report n/a  Other studies Reviewed: Additional studies/ records that were reviewed today include:  Recent Labs:  n/a    ASSESSMENT / PLAN: Problem List Items Addressed This Visit    Electrocution    Relatively normal echo with no wall motion abnormality is are valve disease. Low -normal, but yet still normal EF.      Episodic atrial fibrillation (HCC) (Chronic)    No further episodes on current dose of calcium channel blocker. He had asked about how long he would need to take it. I think I would like her to continue taking diltiazem as he is until the issue of his possible clavicle surgery is complete. Once that is done, then we will convert to every other day diltiazem and then stop. At that point he would use the when necessary dosage for episodes recurrence.  If the episodes do recur he would then go back on diltiazem full-time.        We did talk briefly about the importance of smoking cessation with his family history of CAD. He is trying to work on it, but not yet ready to quit.  Current medicines are reviewed at length with the patient today. (+/- concerns) he  asked about when he can stop diltiazem The following changes have been made:   Patient Instructions  CHANGE  WITH MEDICATIONS  CONTINUE WITH DILTIAZEM UNTIL YOU HAVE SURGERY- AFTER SURGERY   IF YOU WILL LIKE TO TRY AN STOP DILTIAZEM   TAKE DILTIAZEM EVERY OTHER DAY  FOR A WEEK , THEN STOP MEDICATIONS IF YOU HAVE TO USE AS NEEDED DILTIAZEM (PRN)  - GO BACK TO USING DAILY DILTIAZEM.     Your physician wants you to follow-up in SEPT 2018 WITH DR Geral Coker.You will receive a reminder letter in the mail two months in advance. If you don't receive a letter, please call our office to schedule the follow-up appointment.   If you need a refill on your cardiac medications before your next appointment, please call your pharmacy.    Studies Ordered:   No orders of the defined types were placed in  this encounter.     Glenetta Hew, M.D., M.S. Interventional Cardiologist   Pager # [phone] Phone # [phone] [address] [place] [address] [place], [place] [address]

## 2016-11-24 ENCOUNTER — Encounter: Payer: Self-pay | Admitting: Cardiology

## 2016-11-24 NOTE — Assessment & Plan Note (Addendum)
No further episodes on current dose of calcium channel blocker. He had asked about how long he would need to take it. I think I would like her to continue taking diltiazem as he is until the issue of his possible clavicle surgery is complete. Once that is done, then we will convert to every other day diltiazem and then stop. At that point he would use the when necessary dosage for episodes recurrence.  If the episodes do recur he would then go back on diltiazem full-time.

## 2016-11-24 NOTE — Assessment & Plan Note (Addendum)
Relatively normal echo with no wall motion abnormality is are valve disease. Low -normal, but yet still normal EF.

## 2016-12-27 ENCOUNTER — Other Ambulatory Visit: Payer: Self-pay | Admitting: Orthopedic Surgery

## 2017-01-01 ENCOUNTER — Other Ambulatory Visit: Payer: Self-pay | Admitting: Orthopedic Surgery

## 2017-01-01 DIAGNOSIS — S42001K Fracture of unspecified part of right clavicle, subsequent encounter for fracture with nonunion: Secondary | ICD-10-CM

## 2017-01-05 ENCOUNTER — Ambulatory Visit
Admission: RE | Admit: 2017-01-05 | Discharge: 2017-01-05 | Disposition: A | Discharge status: Home / Self Care | Payer: Worker's Compensation | Source: Ambulatory Visit | Attending: Orthopedic Surgery | Admitting: Orthopedic Surgery

## 2017-01-05 ENCOUNTER — Other Ambulatory Visit: Payer: Worker's Compensation

## 2017-01-05 DIAGNOSIS — S42001K Fracture of unspecified part of right clavicle, subsequent encounter for fracture with nonunion: Secondary | ICD-10-CM

## 2017-01-25 ENCOUNTER — Encounter (HOSPITAL_COMMUNITY): Payer: Self-pay | Admitting: *Deleted

## 2017-01-29 ENCOUNTER — Telehealth: Payer: Self-pay | Admitting: Cardiology

## 2017-01-29 NOTE — H&P (Signed)
[facility]thopaedic [facility]uma Service H&P    Chief Complaint:  R clavicle nonunion  HPI:   31 y/o male s/p fall after electrocution at work on 07/2016. Sustained R medial clavicle fracture. Treated non-op. Pt has developed symptomatic nonunion. Presents for ORIF    Pt went into afib with RVR. He was converted during that hospitalization with meds. He has been followed closely by cards. No additional workup necessary before proceeding to OR for nonunion repair   Strong family hx of heard disease   Past Medical History:  Diagnosis Date  . Anxiety   . Closed right clavicular fracture 07/2016   After falling from 6 foot ladder in the sequela of the electrocuted   . Depression   . 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 Episodic atrial fibrillation (HCC) 07/2016   Following electrocution  . History of posttraumatic stress disorder (PTSD)     Past Surgical History:  Procedure Laterality Date  . HAND SURGERY Right    fracture surgery- has a metal plate    Family History  Problem Relation Age of Onset  . Coronary artery disease Mother 31  . Coronary artery disease Father 69    Status post CABG  . Coronary artery disease Paternal Grandfather   . Coronary artery disease Paternal Uncle     In his 60s  . Stroke Paternal Uncle    Social History:  reports that he quit smoking 11 days ago. He has never used smokeless tobacco. He reports that he drinks about 2.4 oz of alcohol per week . He reports that he uses drugs, including Marijuana.  Allergies:  Allergies  Allergen Reactions  . Morphine And Related Rash    07/18/16:  a rash that is "not serious", tolerates Hydromorphone   No current facility-administered medications on file prior to encounter.    Current Outpatient Prescriptions on File Prior to Encounter  Medication Sig Dispense Refill  . diltiazem (CARDIZEM CD) 120 MG 24 hr capsule Take 1 capsule (120 mg total) by mouth at bedtime. 90 capsule 3  .  diltiazem (CARDIZEM) 60 MG tablet Take 1 tablet (60 mg total) by mouth 4 (four) times daily as needed (High heart rate). 10 tablet 0  . pregabalin (LYRICA) 75 MG capsule Take 75 mg by mouth 2 (two) times daily.      No results found for this or any previous visit (from the past 48 hour(s)). No results found.  Review of Systems  Constitutional: Negative for chills and fever.  Eyes: Negative for blurred vision.  Respiratory: Negative for shortness of breath and wheezing.   Cardiovascular: Negative for chest pain and palpitations.  Gastrointestinal: Negative for nausea and vomiting.  Neurological: Negative for sensory change.    Vitals on arrival to short stay   Physical Exam  Constitutional: He is oriented to person, place, and time. He appears well-developed and well-nourished. No distress.  Respiratory: Effort normal and breath sounds normal.  Musculoskeletal:  R upper extremity    Motor and sensory functions intact   Ext warm     Gross motion at nonunion site noted    + radial pulse   Neurological: He is alert and oriented to person, place, and time.     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

## 2017-01-29 NOTE — Telephone Encounter (Signed)
No need for formal pre-op evaluation for shoulder surgery.  Bryan Lemma, MD

## 2017-01-29 NOTE — Telephone Encounter (Signed)
New Message     Calling about Pt Paul Clarke

## 2017-01-29 NOTE — Telephone Encounter (Signed)
DR Herbie Baltimore spoke to Dr HANDY in regards this patient

## 2017-01-30 ENCOUNTER — Encounter (HOSPITAL_COMMUNITY)
Admission: RE | Disposition: A | Discharge status: Home / Self Care | Payer: Self-pay | Source: Ambulatory Visit | Attending: Orthopedic Surgery

## 2017-01-30 ENCOUNTER — Ambulatory Visit (HOSPITAL_COMMUNITY): Payer: Worker's Compensation

## 2017-01-30 ENCOUNTER — Encounter (HOSPITAL_COMMUNITY): Payer: Self-pay | Admitting: Certified Registered Nurse Anesthetist

## 2017-01-30 ENCOUNTER — Ambulatory Visit (HOSPITAL_COMMUNITY): Payer: Worker's Compensation | Admitting: Certified Registered Nurse Anesthetist

## 2017-01-30 ENCOUNTER — Ambulatory Visit (HOSPITAL_COMMUNITY)
Admission: RE | Admit: 2017-01-30 | Discharge: 2017-01-30 | Disposition: A | Discharge status: Home / Self Care | Payer: Worker's Compensation | Source: Ambulatory Visit | Attending: Orthopedic Surgery | Admitting: Orthopedic Surgery

## 2017-01-30 DIAGNOSIS — F329 Major depressive disorder, single episode, unspecified: Secondary | ICD-10-CM | POA: Diagnosis not present

## 2017-01-30 DIAGNOSIS — W11XXXA Fall on and from ladder, initial encounter: Secondary | ICD-10-CM | POA: Diagnosis not present

## 2017-01-30 DIAGNOSIS — S42009K Fracture of unspecified part of unspecified clavicle, subsequent encounter for fracture with nonunion: Secondary | ICD-10-CM

## 2017-01-30 DIAGNOSIS — S42011A Anterior displaced fracture of sternal end of right clavicle, initial encounter for closed fracture: Secondary | ICD-10-CM | POA: Insufficient documentation

## 2017-01-30 DIAGNOSIS — Z419 Encounter for procedure for purposes other than remedying health state, unspecified: Secondary | ICD-10-CM

## 2017-01-30 DIAGNOSIS — Z87891 Personal history of nicotine dependence: Secondary | ICD-10-CM | POA: Diagnosis not present

## 2017-01-30 DIAGNOSIS — F4310 Post-traumatic stress disorder, unspecified: Secondary | ICD-10-CM | POA: Insufficient documentation

## 2017-01-30 DIAGNOSIS — I4891 Unspecified atrial fibrillation: Secondary | ICD-10-CM | POA: Diagnosis not present

## 2017-01-30 DIAGNOSIS — W19XXXD Unspecified fall, subsequent encounter: Secondary | ICD-10-CM | POA: Diagnosis not present

## 2017-01-30 DIAGNOSIS — S42011K Anterior displaced fracture of sternal end of right clavicle, subsequent encounter for fracture with nonunion: Secondary | ICD-10-CM | POA: Diagnosis present

## 2017-01-30 DIAGNOSIS — Z885 Allergy status to narcotic agent status: Secondary | ICD-10-CM | POA: Insufficient documentation

## 2017-01-30 DIAGNOSIS — M24411 Recurrent dislocation, right shoulder: Secondary | ICD-10-CM | POA: Insufficient documentation

## 2017-01-30 HISTORY — PX: ORIF CLAVICULAR FRACTURE: SHX5055

## 2017-01-30 HISTORY — DX: Depression, unspecified: F32.A

## 2017-01-30 HISTORY — DX: Cardiac arrhythmia, unspecified: I49.9

## 2017-01-30 HISTORY — DX: Anxiety disorder, unspecified: F41.9

## 2017-01-30 HISTORY — DX: Major depressive disorder, single episode, unspecified: F32.9

## 2017-01-30 LAB — CBC WITH DIFFERENTIAL/PLATELET
BASOS ABS: 0 10*3/uL (ref 0.0–0.1)
Basophils Relative: 1 %
Eosinophils Absolute: 0.4 10*3/uL (ref 0.0–0.7)
Eosinophils Relative: 5 %
HCT: 47.6 % (ref 39.0–52.0)
HEMOGLOBIN: 16.8 g/dL (ref 13.0–17.0)
Lymphocytes Relative: 25 %
Lymphs Abs: 2.2 10*3/uL (ref 0.7–4.0)
MCH: 32.9 pg (ref 26.0–34.0)
MCHC: 35.3 g/dL (ref 30.0–36.0)
MCV: 93.2 fL (ref 78.0–100.0)
MONO ABS: 0.9 10*3/uL (ref 0.1–1.0)
MONOS PCT: 11 %
Neutro Abs: 5.2 10*3/uL (ref 1.7–7.7)
Neutrophils Relative %: 58 %
Platelets: 246 10*3/uL (ref 150–400)
RBC: 5.11 MIL/uL (ref 4.22–5.81)
RDW: 13 % (ref 11.5–15.5)
WBC: 8.8 10*3/uL (ref 4.0–10.5)

## 2017-01-30 LAB — COMPREHENSIVE METABOLIC PANEL
ALBUMIN: 4.2 g/dL (ref 3.5–5.0)
ALK PHOS: 55 U/L (ref 38–126)
ALT: 35 U/L (ref 17–63)
AST: 41 U/L (ref 15–41)
Anion gap: 11 (ref 5–15)
BUN: 10 mg/dL (ref 6–20)
CALCIUM: 9.1 mg/dL (ref 8.9–10.3)
CO2: 23 mmol/L (ref 22–32)
CREATININE: 0.9 mg/dL (ref 0.61–1.24)
Chloride: 103 mmol/L (ref 101–111)
GFR calc non Af Amer: >60 mL/min (ref >60)
GLUCOSE: 98 mg/dL (ref 65–99)
Potassium: 4.2 mmol/L (ref 3.5–5.1)
Sodium: 137 mmol/L (ref 135–145)
Total Bilirubin: 0.8 mg/dL (ref 0.3–1.2)
Total Protein: 6.9 g/dL (ref 6.5–8.1)

## 2017-01-30 LAB — RAPID URINE DRUG SCREEN, HOSP PERFORMED
AMPHETAMINES: NOT DETECTED
Barbiturates: NOT DETECTED
Benzodiazepines: NOT DETECTED
COCAINE: NOT DETECTED
OPIATES: NOT DETECTED
TETRAHYDROCANNABINOL: POSITIVE — AB

## 2017-01-30 SURGERY — OPEN REDUCTION INTERNAL FIXATION (ORIF) CLAVICULAR FRACTURE
Anesthesia: General | Laterality: Right

## 2017-01-30 MED ORDER — HYDROMORPHONE HCL 1 MG/ML IJ SOLN
INTRAMUSCULAR | Status: AC
  Filled 2017-01-30: qty 0.5

## 2017-01-30 MED ORDER — MIDAZOLAM HCL 5 MG/5ML IJ SOLN
INTRAMUSCULAR | Status: DC | PRN
  Administered 2017-01-30: 2 mg via INTRAVENOUS

## 2017-01-30 MED ORDER — ONDANSETRON HCL 4 MG PO TABS: 4.0000 mg | ORAL_TABLET | Freq: Three times a day (TID) | ORAL | 0 refills | Status: DC | PRN

## 2017-01-30 MED ORDER — SUGAMMADEX SODIUM 200 MG/2ML IV SOLN
INTRAVENOUS | Status: DC | PRN
  Administered 2017-01-30: 200 mg via INTRAVENOUS

## 2017-01-30 MED ORDER — FENTANYL CITRATE (PF) 100 MCG/2ML IJ SOLN
INTRAMUSCULAR | Status: DC | PRN
  Administered 2017-01-30 (×2): 100 ug via INTRAVENOUS
  Administered 2017-01-30: 150 ug via INTRAVENOUS
  Administered 2017-01-30: 100 ug via INTRAVENOUS
  Administered 2017-01-30: 50 ug via INTRAVENOUS

## 2017-01-30 MED ORDER — CEFAZOLIN SODIUM-DEXTROSE 2-4 GM/100ML-% IV SOLN
2.0000 g | INTRAVENOUS | Status: AC
  Administered 2017-01-30: 2 g via INTRAVENOUS
  Filled 2017-01-30: qty 100

## 2017-01-30 MED ORDER — HYDROMORPHONE HCL 1 MG/ML IJ SOLN
0.2500 mg | INTRAMUSCULAR | Status: DC | PRN
  Administered 2017-01-30 (×4): 0.5 mg via INTRAVENOUS

## 2017-01-30 MED ORDER — METHOCARBAMOL 500 MG PO TABS: 500.0000 mg | ORAL_TABLET | Freq: Four times a day (QID) | ORAL | 0 refills | Status: DC | PRN

## 2017-01-30 MED ORDER — SUCCINYLCHOLINE CHLORIDE 20 MG/ML IJ SOLN
INTRAMUSCULAR | Status: DC | PRN
  Administered 2017-01-30: 120 mg via INTRAVENOUS

## 2017-01-30 MED ORDER — PROPOFOL 10 MG/ML IV BOLUS
INTRAVENOUS | Status: AC
  Filled 2017-01-30: qty 20

## 2017-01-30 MED ORDER — 0.9 % SODIUM CHLORIDE (POUR BTL) OPTIME
TOPICAL | Status: DC | PRN
  Administered 2017-01-30: 1000 mL

## 2017-01-30 MED ORDER — OXYCODONE-ACETAMINOPHEN 5-325 MG PO TABS: 1.0000 | ORAL_TABLET | Freq: Four times a day (QID) | ORAL | 0 refills | Status: DC | PRN

## 2017-01-30 MED ORDER — FENTANYL CITRATE (PF) 250 MCG/5ML IJ SOLN
INTRAMUSCULAR | Status: AC
  Filled 2017-01-30: qty 5

## 2017-01-30 MED ORDER — LACTATED RINGERS IV SOLN
INTRAVENOUS | Status: DC
  Administered 2017-01-30 (×3): via INTRAVENOUS

## 2017-01-30 MED ORDER — OXYCODONE HCL 5 MG PO TABS: 5.0000 mg | ORAL_TABLET | Freq: Four times a day (QID) | ORAL | 0 refills | Status: DC | PRN

## 2017-01-30 MED ORDER — PHENYLEPHRINE HCL 10 MG/ML IJ SOLN
INTRAMUSCULAR | Status: DC | PRN
  Administered 2017-01-30 (×3): 80 ug via INTRAVENOUS

## 2017-01-30 MED ORDER — ROCURONIUM BROMIDE 100 MG/10ML IV SOLN
INTRAVENOUS | Status: DC | PRN
  Administered 2017-01-30: 50 mg via INTRAVENOUS
  Administered 2017-01-30: 30 mg via INTRAVENOUS
  Administered 2017-01-30: 20 mg via INTRAVENOUS

## 2017-01-30 MED ORDER — EPHEDRINE SULFATE 50 MG/ML IJ SOLN
INTRAMUSCULAR | Status: DC | PRN
  Administered 2017-01-30: 10 mg via INTRAVENOUS

## 2017-01-30 MED ORDER — CHLORHEXIDINE GLUCONATE 4 % EX LIQD: 60.0000 mL | Freq: Once | CUTANEOUS | Status: DC

## 2017-01-30 MED ORDER — LIDOCAINE HCL (CARDIAC) 20 MG/ML IV SOLN
INTRAVENOUS | Status: DC | PRN
  Administered 2017-01-30: 80 mg via INTRAVENOUS

## 2017-01-30 MED ORDER — HYDROMORPHONE HCL 1 MG/ML IJ SOLN
INTRAMUSCULAR | Status: AC
  Filled 2017-01-30: qty 1

## 2017-01-30 MED ORDER — PROMETHAZINE HCL 25 MG/ML IJ SOLN: 6.2500 mg | INTRAMUSCULAR | Status: DC | PRN

## 2017-01-30 MED ORDER — METHOCARBAMOL 500 MG PO TABS
ORAL_TABLET | ORAL | Status: AC
  Administered 2017-01-30: 500 mg
  Filled 2017-01-30: qty 1

## 2017-01-30 MED ORDER — MIDAZOLAM HCL 2 MG/2ML IJ SOLN
INTRAMUSCULAR | Status: AC
Start: 2017-01-30 — End: 2017-01-30
  Filled 2017-01-30: qty 2

## 2017-01-30 MED ORDER — ONDANSETRON HCL 4 MG/2ML IJ SOLN
INTRAMUSCULAR | Status: DC | PRN
  Administered 2017-01-30: 4 mg via INTRAVENOUS

## 2017-01-30 MED ORDER — PROPOFOL 10 MG/ML IV BOLUS
INTRAVENOUS | Status: DC | PRN
  Administered 2017-01-30: 40 mg via INTRAVENOUS
  Administered 2017-01-30: 160 mg via INTRAVENOUS

## 2017-01-30 SURGICAL SUPPLY — 61 items
ANCHOR SUT 1.45 SZ 1 SHORT (Anchor) ×6 IMPLANT
BENZOIN TINCTURE PRP APPL 2/3 (GAUZE/BANDAGES/DRESSINGS) ×3 IMPLANT
BIT DRILL 100X2XQC STRL (BIT) ×1 IMPLANT
BIT DRILL QC 2.0X100 (BIT) ×2
BIT DRL 100X2XQC STRL (BIT) ×1
BNDG COHESIVE 4X5 TAN STRL (GAUZE/BANDAGES/DRESSINGS) IMPLANT
BRUSH SCRUB DISP (MISCELLANEOUS) ×6 IMPLANT
CLOSURE STERI-STRIP 1/2X4 (GAUZE/BANDAGES/DRESSINGS) ×1
CLOSURE WOUND 1/2 X4 (GAUZE/BANDAGES/DRESSINGS) ×1
CLSR STERI-STRIP ANTIMIC 1/2X4 (GAUZE/BANDAGES/DRESSINGS) ×2 IMPLANT
COVER SURGICAL LIGHT HANDLE (MISCELLANEOUS) ×3 IMPLANT
DRAPE C-ARM 42X72 X-RAY (DRAPES) ×3 IMPLANT
DRAPE INCISE IOBAN 66X45 STRL (DRAPES) ×3 IMPLANT
DRAPE LAPAROTOMY TRNSV 102X78 (DRAPE) ×3 IMPLANT
DRAPE ORTHO SPLIT 77X108 STRL (DRAPES)
DRAPE SURG ORHT 6 SPLT 77X108 (DRAPES) IMPLANT
DRAPE U-SHAPE 47X51 STRL (DRAPES) ×6 IMPLANT
DRSG MEPILEX BORDER 4X8 (GAUZE/BANDAGES/DRESSINGS) ×3 IMPLANT
ELECT REM PT RETURN 9FT ADLT (ELECTROSURGICAL) ×3
ELECTRODE REM PT RTRN 9FT ADLT (ELECTROSURGICAL) ×1 IMPLANT
GLOVE BIO SURGEON STRL SZ7.5 (GLOVE) ×3 IMPLANT
GLOVE BIO SURGEON STRL SZ8 (GLOVE) ×3 IMPLANT
GLOVE BIOGEL PI IND STRL 7.5 (GLOVE) ×1 IMPLANT
GLOVE BIOGEL PI IND STRL 8 (GLOVE) ×1 IMPLANT
GLOVE BIOGEL PI INDICATOR 7.5 (GLOVE) ×2
GLOVE BIOGEL PI INDICATOR 8 (GLOVE) ×2
GOWN STRL REUS W/ TWL LRG LVL3 (GOWN DISPOSABLE) ×2 IMPLANT
GOWN STRL REUS W/ TWL XL LVL3 (GOWN DISPOSABLE) ×1 IMPLANT
GOWN STRL REUS W/TWL LRG LVL3 (GOWN DISPOSABLE) ×4
GOWN STRL REUS W/TWL XL LVL3 (GOWN DISPOSABLE) ×2
KIT BASIN OR (CUSTOM PROCEDURE TRAY) ×3 IMPLANT
KIT ROOM TURNOVER OR (KITS) ×3 IMPLANT
MANIFOLD NEPTUNE II (INSTRUMENTS) ×3 IMPLANT
NEEDLE HYPO 25GX1X1/2 BEV (NEEDLE) IMPLANT
NS IRRIG 1000ML POUR BTL (IV SOLUTION) ×3 IMPLANT
PACK GENERAL/GYN (CUSTOM PROCEDURE TRAY) ×3 IMPLANT
PAD ARMBOARD 7.5X6 YLW CONV (MISCELLANEOUS) ×3 IMPLANT
PASSER SUT SWANSON 36MM LOOP (INSTRUMENTS) ×3 IMPLANT
PLATE LC CDP 2.4 6H (Plate) ×3 IMPLANT
RETRIEVER SUT HEWSON (MISCELLANEOUS) ×3 IMPLANT
SCREW CORTEX 2.4 8MM (Screw) ×9 IMPLANT
SLING ARM IMMOBILIZER LRG (SOFTGOODS) ×6 IMPLANT
SLING ARM IMMOBILIZER MED (SOFTGOODS) IMPLANT
STAPLER VISISTAT 35W (STAPLE) ×3 IMPLANT
STOCKINETTE IMPERVIOUS 9X36 MD (GAUZE/BANDAGES/DRESSINGS) IMPLANT
STRIP CLOSURE SKIN 1/2X4 (GAUZE/BANDAGES/DRESSINGS) ×2 IMPLANT
SUCTION FRAZIER HANDLE 10FR (MISCELLANEOUS) ×2
SUCTION TUBE FRAZIER 10FR DISP (MISCELLANEOUS) ×1 IMPLANT
SUT FIBERWIRE #5 38 CONV NDL (SUTURE) ×9
SUT MNCRL AB 3-0 PS2 27 (SUTURE) IMPLANT
SUT VIC AB 0 CT1 27 (SUTURE)
SUT VIC AB 0 CT1 27XBRD ANBCTR (SUTURE) IMPLANT
SUT VIC AB 1 CT1 27 (SUTURE) ×2
SUT VIC AB 1 CT1 27XBRD ANBCTR (SUTURE) ×1 IMPLANT
SUT VIC AB 2-0 CT1 27 (SUTURE)
SUT VIC AB 2-0 CT1 TAPERPNT 27 (SUTURE) IMPLANT
SUTURE FIBERWR #5 38 CONV NDL (SUTURE) ×3 IMPLANT
SYR CONTROL 10ML LL (SYRINGE) IMPLANT
TOWEL OR 17X24 6PK STRL BLUE (TOWEL DISPOSABLE) ×3 IMPLANT
TOWEL OR 17X26 10 PK STRL BLUE (TOWEL DISPOSABLE) ×3 IMPLANT
WATER STERILE IRR 1000ML POUR (IV SOLUTION) IMPLANT

## 2017-01-30 NOTE — Transfer of Care (Signed)
Immediate Anesthesia Transfer of Care Note  Patient: Paul Clarke  Procedure(s) Performed: Procedure(s): OPEN REDUCTION INTERNAL FIXATION (ORIF) CLAVICULAR FRACTURE (Right)  Patient Location: PACU  Anesthesia Type:General  Level of Consciousness: awake, alert  and oriented  Airway & Oxygen Therapy: Patient Spontanous Breathing and Patient connected to nasal cannula oxygen  Post-op Assessment: Report given to RN and Post -op Vital signs reviewed and stable  Post vital signs: Reviewed and stable  Last Vitals:  Vitals:   01/30/17 0713 01/30/17 1230  BP: (!) 142/85 (!) (P) 149/85  Pulse: 92   Resp: 20   Temp: 36.8 C (P) 36.5 C    Last Pain:  Vitals:   01/30/17 1230  TempSrc:   PainSc: (P) 0-No pain      Patients Stated Pain Goal: 5 (01/30/17 1610)  Complications: No apparent anesthesia complications

## 2017-01-30 NOTE — Anesthesia Postprocedure Evaluation (Addendum)
Anesthesia Post Note  Patient: Paul Clarke  Procedure(s) Performed: Procedure(s) (LRB): OPEN REDUCTION INTERNAL FIXATION (ORIF) CLAVICULAR FRACTURE (Right)  Patient location during evaluation: PACU Anesthesia Type: General Level of consciousness: awake and sedated Pain management: pain level controlled Vital Signs Assessment: post-procedure vital signs reviewed and stable Respiratory status: spontaneous breathing, nonlabored ventilation, respiratory function stable and patient connected to nasal cannula oxygen Cardiovascular status: blood pressure returned to baseline and stable Postop Assessment: no signs of nausea or vomiting Anesthetic complications: no       Last Vitals:  Vitals:   01/30/17 1400 01/30/17 1415  BP: (!) 155/93 (!) 151/87  Pulse: 95 93  Resp: 20 16  Temp:      Last Pain:  Vitals:   01/30/17 1345  TempSrc:   PainSc: Asleep                 Reyes Fifield,JAMES TERRILL

## 2017-01-30 NOTE — Anesthesia Preprocedure Evaluation (Signed)
Anesthesia Evaluation  Patient identified by MRN, date of birth, ID band  Reviewed: Allergy & Precautions, NPO status , Patient's Chart, lab work & pertinent test results  Airway Mallampati: II   Neck ROM: Full    Dental no notable dental hx.    Pulmonary former smoker,    breath sounds clear to auscultation       Cardiovascular negative cardio ROS  + dysrhythmias  Rhythm:Regular Rate:Normal     Neuro/Psych    GI/Hepatic (+) Hepatitis -, C  Endo/Other    Renal/GU      Musculoskeletal   Abdominal   Peds  Hematology negative hematology ROS (+)   Anesthesia Other Findings   Reproductive/Obstetrics                             Anesthesia Physical Anesthesia Plan  ASA: II  Anesthesia Plan: General   Post-op Pain Management:    Induction: Intravenous  Airway Management Planned: Oral ETT  Additional Equipment:   Intra-op Plan:   Post-operative Plan: Extubation in OR  Informed Consent: I have reviewed the patients History and Physical, chart, labs and discussed the procedure including the risks, benefits and alternatives for the proposed anesthesia with the patient or authorized representative who has indicated his/her understanding and acceptance.     Plan Discussed with:   Anesthesia Plan Comments:         Anesthesia Quick Evaluation

## 2017-01-30 NOTE — Anesthesia Procedure Notes (Signed)
Procedure Name: Intubation Date/Time: 01/30/2017 8:45 AM Performed by: Ollen Bowl Pre-anesthesia Checklist: Patient identified, Emergency Drugs available, Suction available, Patient being monitored and Timeout performed Patient Re-evaluated:Patient Re-evaluated prior to inductionOxygen Delivery Method: Circle system utilized and Simple face mask Preoxygenation: Pre-oxygenation with 100% oxygen Intubation Type: Combination inhalational/ intravenous induction Ventilation: Mask ventilation without difficulty Laryngoscope Size: Mac and 4 Grade View: Grade II Tube type: Oral Tube size: 7.5 mm Number of attempts: 1 Airway Equipment and Method: Patient positioned with wedge pillow and Stylet Placement Confirmation: ETT inserted through vocal cords under direct vision,  positive ETCO2 and breath sounds checked- equal and bilateral Secured at: 22 cm Tube secured with: Tape Dental Injury: Teeth and Oropharynx as per pre-operative assessment

## 2017-01-31 ENCOUNTER — Encounter (HOSPITAL_COMMUNITY): Payer: Self-pay | Admitting: Orthopedic Surgery

## 2017-01-31 LAB — VITAMIN D 25 HYDROXY (VIT D DEFICIENCY, FRACTURES): VIT D 25 HYDROXY: 14.6 ng/mL — AB (ref 30.0–100.0)

## 2017-02-12 NOTE — Op Note (Signed)
NAME:  DARELD, MCAULIFFE                  ACCOUNT NO.:  MEDICAL RECORD NO.:  [id number]  LOCATION:                                 FACILITY:  PHYSICIAN:  Doralee Albino. Carola Frost, M.D.      DATE OF BIRTH:  DATE OF PROCEDURE:  01/30/2017 DATE OF DISCHARGE:                              OPERATIVE REPORT   PREOPERATIVE DIAGNOSIS:  Right medial clavicle nonunion.  POSTOPERATIVE DIAGNOSES: 1. 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  PROCEDURES: 1. Repair of right clavicle malunion. 2. Reconstruction of chronic dislocation, right sternoclavicular     joint.  SURGEON:  Doralee Albino. Carola Frost, M.D.  ASSISTANT:  Montez Morita, PA-C.  ANESTHESIA:  General.  COMPLICATIONS:  None.  DISPOSITION:  To PACU.  CONDITION:  Stable.  BRIEF SUMMARY OF INDICATION FOR PROCEDURE:  Paul Clarke is a 31 year old male who sustained a severe left medial clavicle injury as well as others in an electrocution incident.  The patient was managed for considerable period of time nonoperatively and continued to have obvious instability and motion at the fracture site as well as pain preventing his return to work.  We have obtained a CT scan preoperatively that demonstrated possibility of union, but did show large areas of nonunion and suggested that the plane of nonunion was out of frame.  I did discuss with the patient preoperatively as well as his wife the risks and benefits of surgical treatment including the potential for mediastinal vessel injury that could be catastrophic and result in death, failure to obtain stability or union, need for further surgery, the possibility of taking iliac crest bone graft in the event that we found a large defect as was anticipated and infection as well as others. After full discussion, the patient did wish to proceed.  BRIEF SUMMARY OF PROCEDURE:  The patient was taken to the operating room where general anesthesia was induced.  He  did receive preoperative antibiotics.  He was positioned with a bump under the medial shoulder blade to facilitate exposure.  The iliac crest was prepped as well in expectation of obtaining graft.  A linear incision was made in line with the clavicle over the bony prominence of the medial side.  Dissection was carried carefully down to the nonunion site and this gap developed extensively.  The dissection was exceedingly difficult and technical given the immediately adjacent mediastinal structures.  As dissection continued around to isolate into the bone, it was difficult to find the nonunion site despite identifying large gaps between the bones, appeared to move as a unit.  As this continued further, we could definitively confirm that the fracture though severely displaced, had united, but that the gross motion observed was coming from an unstable sternoclavicular joint.  Consequently, began with treatment of the malunion first excising the bony promises that prevented accurate reduction and tapering this back to the healthy bleeding edge.  As much cortical bone as could be preserved was to facilitate a solid grasp with our fixation.  My assistant, Montez Morita, was careful to protect the mediastinal structures throughout treatment of the clavicular malunion.  Attention was then turned to the sternoclavicular joint.  Here, a web of sutures using #2 FiberWire was passed both imbricating and reinforcing the medial side of the clavicle as well as the lateral aspect of the sternum.  We were able to pass a total of 6 strands and did use suture anchors as well to further support this reconstruction. [name] produced and held the reduction while I sequentially tightened and then secured all sutures. At the conclusion of the procedure, this appeared to have produced stability with much improved alignment and cosmetic appearance.  Again, Montez MoritaKeith Paul, assisted throughout and was critical to protect  the mediastinal structures as the suture needle passes with large FiberWire had to be done, such that, I would pass the needle and he would grasp the other side with an additional needle driver.  Standard layered closure was performed including 0 Vicryl, 2-0 Vicryl and 3-0 nylon. Sterile gently compressive dressing was applied.  The patient was taken to the PACU in stable condition.  PROGNOSIS:  [name] will be in a sling with pendulum only and very limited activities for the next 2 weeks.  I do not anticipate increasing that significantly in the ensuing 2 weeks, but certainly would be allowing full elbow motion and hinge motion of the shoulder.  Passive motion will continue, but had a very limited progression and at 6-8 weeks, we will begin more of the active and extreme motion.  He is certainly at increased risk for recurrent instability given the adjacent malunion with shortening to the chronic sternoclavicular dislocation.     Doralee AlbinoMichael H. Carola FrostHandy, M.D.     MHH/MEDQ  D:  02/10/2017  T:  02/10/2017  Job:  [id number]

## 2017-03-09 NOTE — Addendum Note (Signed)
Addendum  created 03/09/17 1417 by Chidera Dearcos, MD   Sign clinical note    

## 2017-07-04 ENCOUNTER — Ambulatory Visit (INDEPENDENT_AMBULATORY_CARE_PROVIDER_SITE_OTHER): Payer: Worker's Compensation | Admitting: Cardiology

## 2017-07-04 ENCOUNTER — Encounter: Payer: Self-pay | Admitting: Cardiology

## 2017-07-04 DIAGNOSIS — I480 Paroxysmal atrial fibrillation: Secondary | ICD-10-CM

## 2017-07-04 MED ORDER — DILTIAZEM HCL 60 MG PO TABS: 60.0000 mg | ORAL_TABLET | ORAL | 11 refills | Status: AC

## 2017-07-04 NOTE — Patient Instructions (Addendum)
NO CHANGES   CONTINUE WITH DILTIAZEM 60 MG  USE IF NEEDED   Your physician wants you to follow-up in 12 MONTHS WITH DR HARDING.You will receive a reminder letter in the mail two months in advance. If you don't receive a letter, please call our office to schedule the follow-up appointment.   If you need a refill on your cardiac medications before your next appointment, please call your pharmacy.

## 2017-07-04 NOTE — Progress Notes (Signed)
PCP: Patient, [name]  Clinic Note: Chief Complaint  Patient presents with  . Follow-up    Atrial fibrillation    HPI: Paul Clarke is a 31 y.o. male with a PMH below who presents today for 3 month f/u for his Afib triggered by electrocution.  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 Broke his right clavicle. While in the emergency room he started having some dyspnea and dizziness was found to be in A. fib with RVR. This was treated with an IV dose of diltiazem and it spontaneously resolved converting to sinus rhythm. He had no further A. fib episodes in the hospital, but has had intermittent short episodes shortly thereafter requiring calcium channel blocker treatment.  He has a pretty significant family history of coronary disease with his mother father and maternal grandfather as well as several uncles.  Paul Clarke was last seen in February of this year, he was doing quite well. He was getting ready to start going back to work.  Unlike the first follow-up, he did not note frequent episodes of bursts of irregular heartbeats. He said he had not used any PRN Diltiazem, but was still on the long-acting dose. --> the plan was to wean off the long-acting diltiazem & use only PRN.  Recent Hospitalizations:   01/30/17 - Shoulder ORIF - Dr. Carola Frost.  Studies Reviewed:   none  Interval History: Paul Clarke presents today now feeling quite well. He has not had any further episodes of palpitations since I last saw him. He is really planned and came off of the long-acting diltiazem dose but is still The as needed diltiazem which she is only used once that he can remember since I last saw him. He is  now back at work, doing well. He did get Dr. once that triggered an episode of fast heart rate. He took his diltiazem that one time and it broke spontaneously pretty quickly.  Otherwise, he has not had any further cardiac symptoms of rapid irregular heartbeat/palpitations, syncope/near-syncope or TIA/amaurosis fugax.  He has never really had any symptoms of chest tightness or pressure with rest or exertion. No PND, orthopnea or edema..  No claudication.  ROS: A comprehensive was performed. Review of Systems  Constitutional: Negative for malaise/fatigue.  Respiratory: Negative for shortness of breath.   Gastrointestinal: Negative.   Genitourinary: Negative.   Musculoskeletal: Positive for joint pain (Shoulder seems to be doing better).  Neurological: Negative for dizziness.  Psychiatric/Behavioral: Negative for depression and memory loss. The patient is nervous/anxious (He still gets a little bit anxious thinking about his accident). The patient does not have insomnia.   All other systems reviewed and are negative.   Past Medical History:  Diagnosis Date  . Anxiety   . Closed right clavicular fracture 07/2016   After falling from 6 foot ladder in the sequela of the electrocuted   . Depression   . 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 Episodic atrial fibrillation (HCC) 07/2016   Following electrocution  . History of posttraumatic stress disorder (PTSD)     Past Surgical History:  Procedure Laterality Date  . HAND SURGERY Right  fracture surgery- has a metal plate  . ORIF CLAVICULAR FRACTURE Right 01/30/2017   Procedure: OPEN REDUCTION INTERNAL FIXATION (ORIF) CLAVICULAR FRACTURE;  Surgeon: Myrene Galas, MD;  Location: [facility] OR;  Service: Orthopedics;  Laterality: Right;  . TRANSTHORACIC ECHOCARDIOGRAM  11/2016   EF 50-55%. Otherwise normal.  No obvious valvular disease. No RWMA.    Current Meds  Medication Sig  . diltiazem (CARDIZEM) 60 MG  tablet Take 1 tablet (60 mg total) by mouth as directed.  . pregabalin (LYRICA) 75 MG capsule Take 75 mg by mouth 2 (two) times daily.  . [DISCONTINUED] diltiazem (CARDIZEM) 60 MG tablet Take 60 mg by mouth as directed.    Allergies  Allergen Reactions  . Morphine And Related Hives, Itching, Rash and Other (See Comments)    07/18/16:  a rash that is "not serious", tolerates Hydromorphone    Social History   Social History  . Marital status: Single    Spouse name: N/A  . Number of children: N/A  . Years of education: N/A   Social History Main Topics  . Smoking status: Former Smoker    Quit date: 01/18/2017  . Smokeless tobacco: Never Used  . Alcohol use 2.4 oz/week    4 Cans of beer per week  . Drug use: Yes    Types: Marijuana     Comment: Last time 01/24/17  . Sexual activity: Not Asked   Other Topics Concern  . None   Social History Narrative   ** Merged History Encounter **        family history includes Coronary artery disease in his paternal grandfather and paternal uncle; Coronary artery disease (age of onset: 38) in his father; Coronary artery disease (age of onset: 15) in his mother; Stroke in his paternal uncle.  Wt Readings from Last 3 Encounters:  07/04/17 170 lb 12.8 oz (77.5 kg)  11/22/16 172 lb (78 kg)  08/17/16 174 lb (78.9 kg)    PHYSICAL EXAM BP 122/86   Pulse 96   Ht  (1.702 m)   Wt 170 lb 12.8 oz (77.5 kg)   SpO2 98%   BMI 26.75 kg/m   Physical Exam  Constitutional: He is oriented to person, place, and time. He appears well-developed and well-nourished. No distress.  Relatively well-groomed. He has shaved his beard.  Eyes: EOM are normal. No scleral icterus.  Neck: Normal range of motion. Neck supple. No hepatojugular reflux and no JVD present. Carotid bruit is not present.  Cardiovascular: Normal rate, regular rhythm, normal heart sounds and intact distal pulses.   Occasional extrasystoles are present. PMI is not displaced.  Exam  reveals no gallop and no friction rub.   No murmur heard. Pulmonary/Chest: Effort normal and breath sounds normal. No respiratory distress. He has no wheezes. He has no rales.  Abdominal: Soft. Bowel sounds are normal. He exhibits no distension. There is no tenderness. There is no rebound.  Musculoskeletal: Normal range of motion. He exhibits no edema.  Neurological: He is alert and oriented to person, place, and time.  Skin: Skin is warm and dry. No rash noted. No erythema.  Psychiatric: He has a normal mood and affect. His behavior is normal. Judgment and thought content normal.  Nursing note and vitals reviewed.   Adult ECG Report n/a  Other studies Reviewed: Additional studies/ records that were reviewed today include:  Recent Labs:  n/a    ASSESSMENT / PLAN: Problem List Items Addressed This Visit    Episodic atrial fibrillation (  HCC) (Chronic)    It seems like the only time he has had A. fib now was triggered by an yet again mother electrocution. He needs to be careful with what he does. I do think he is at maximal medical recovery however from a A. fib standpoint. One episode that was short-lived after being shocked again that responded appropriately to his when necessary diltiazem is quite reassuring.  At this point, I think were fine keeping him off of a long-acting diltiazem medication only use when necessary/pill in the pocket diltiazem. If that is not effective in the future, we would consider flecainide.      Relevant Medications   diltiazem (CARDIZEM) 60 MG tablet     Last visit, we talked about smoking cessation. He is subsequently successfully quit as of April. I congratulated him on his efforts.  Current medicines are reviewed at length with the patient today. (+/- concerns) he asked about when he can stop diltiazem The following changes have been made:   Patient Instructions  NO CHANGES   CONTINUE WITH DILTIAZEM 60 MG  USE IF NEEDED   Your physician wants  you to follow-up in 12 MONTHS WITH DR Abella Shugart.You will receive a reminder letter in the mail two months in advance. If you don't receive a letter, please call our office to schedule the follow-up appointment.   If you need a refill on your cardiac medications before your next appointment, please call your pharmacy.   Studies Ordered:   No orders of the defined types were placed in this encounter.     Bryan Lemma, M.D., M.S. Interventional Cardiologist   Pager # [phone] Phone # [phone] [address] [place], [place] [address]

## 2017-07-06 ENCOUNTER — Encounter: Payer: Self-pay | Admitting: Cardiology

## 2017-07-06 NOTE — Assessment & Plan Note (Signed)
It seems like the only time he has had A. fib now was triggered by an yet again mother electrocution. He needs to be careful with what he does. I do think he is at maximal medical recovery however from a A. fib standpoint. One episode that was short-lived after being shocked again that responded appropriately to his when necessary diltiazem is quite reassuring.  At this point, I think were fine keeping him off of a long-acting diltiazem medication only use when necessary/pill in the pocket diltiazem. If that is not effective in the future, we would consider flecainide.

## 2017-11-03 IMAGING — CT CT HEAD W/O CM
5 of 8 series · 17 of 47 positions shown, 18 images · non-contrast
Comparison: None.

CLINICAL DATA: Electrocution with fall from ladder

EXAM:
CT HEAD WITHOUT CONTRAST
CT CERVICAL SPINE WITHOUT CONTRAST
TECHNIQUE: Multidetector CT imaging of the head and cervical spine was
performed following the standard protocol without intravenous
contrast. Multiplanar CT image reconstructions of the cervical spine
were also generated.

[Series 3: head without · axial · non-contrast · 0.45mm/px · z∈[+976,+1121]mm · 3 of 30 slices shown, 4 images]
[im 1/30  brain]
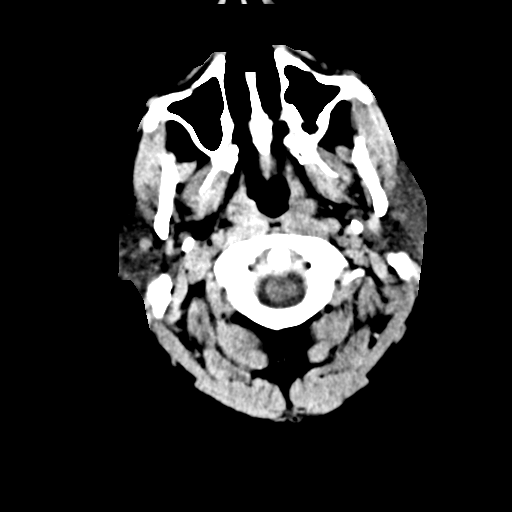
[im 1/30  bone]
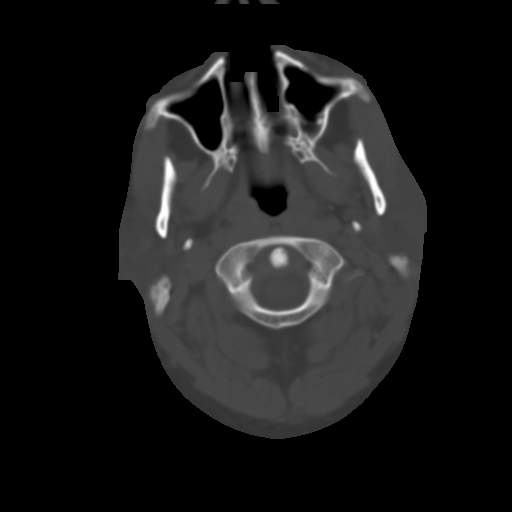
[im 15/30  brain]
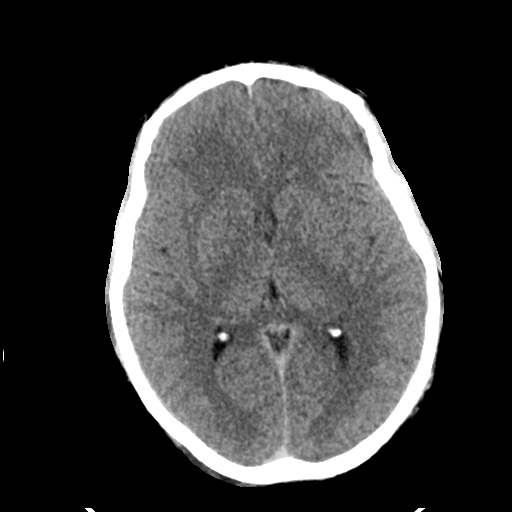
[im 30/30  brain]
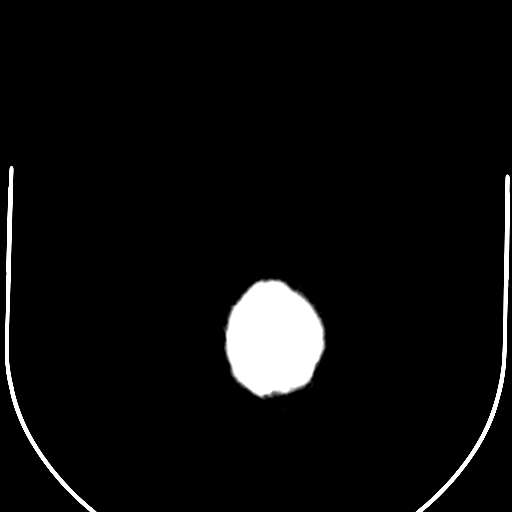

[Series 4: head bone · axial · 0.45mm/px · z∈[+998,+1108]mm · 6 of 78 slices shown]
[im 12/78  bone]
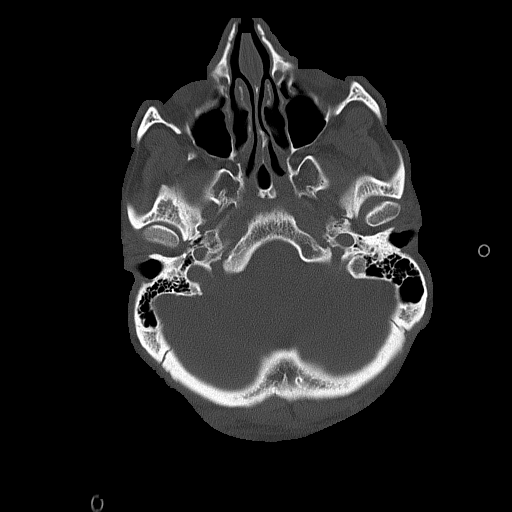
[im 23/78  bone]
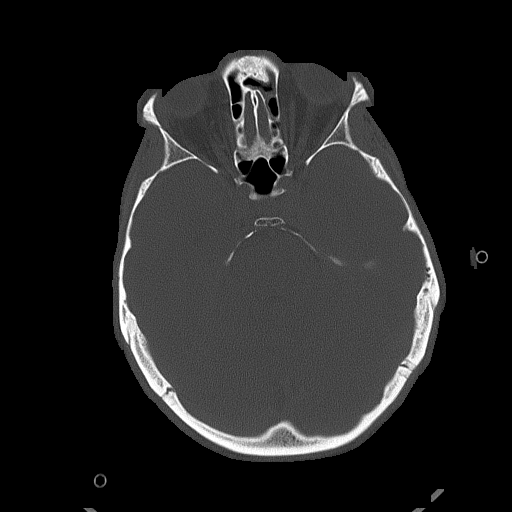
[im 34/78  bone]
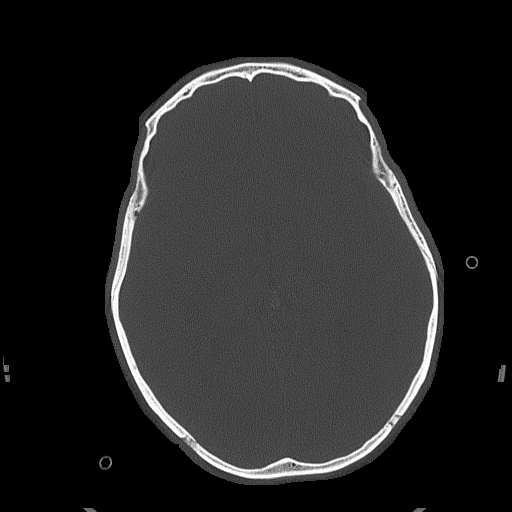
[im 45/78  bone]
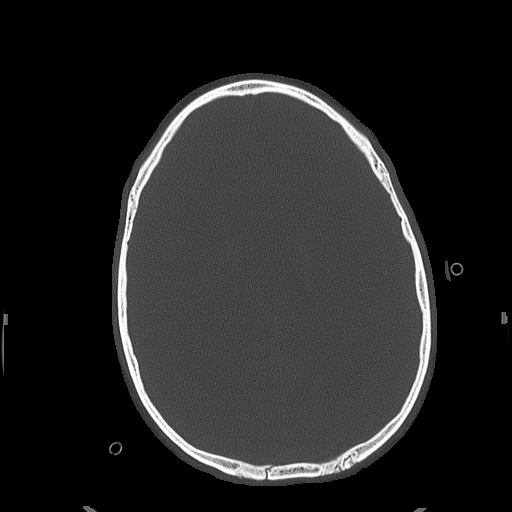
[im 56/78  bone]
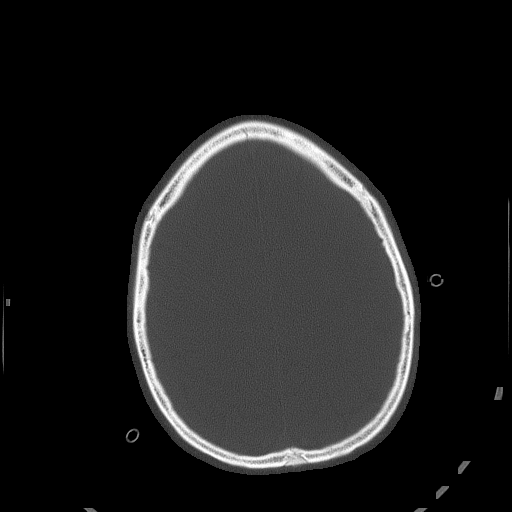
[im 67/78  bone]
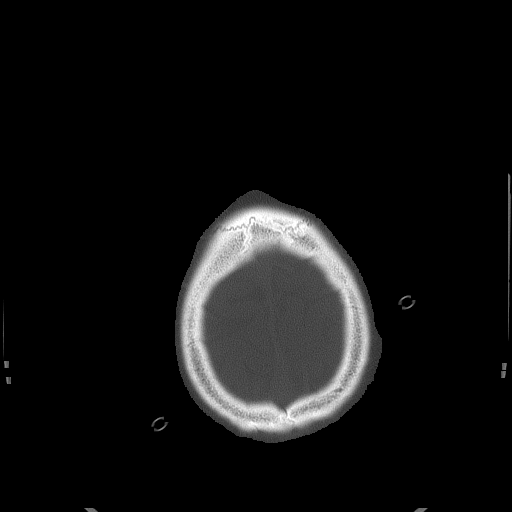

[Series 5: head without cor · coronal · non-contrast · 0.30mm/px · 3 of 67 slices shown]
[im 17/67  brain]
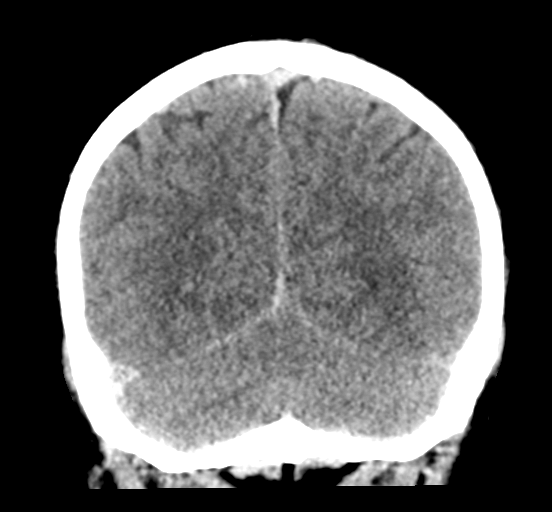
[im 34/67  brain]
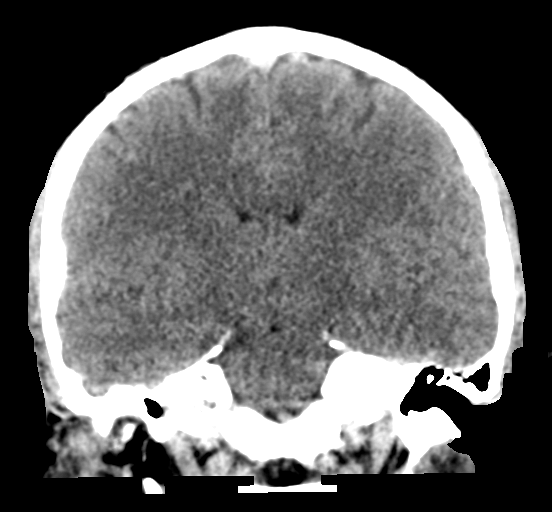
[im 50/67  brain]
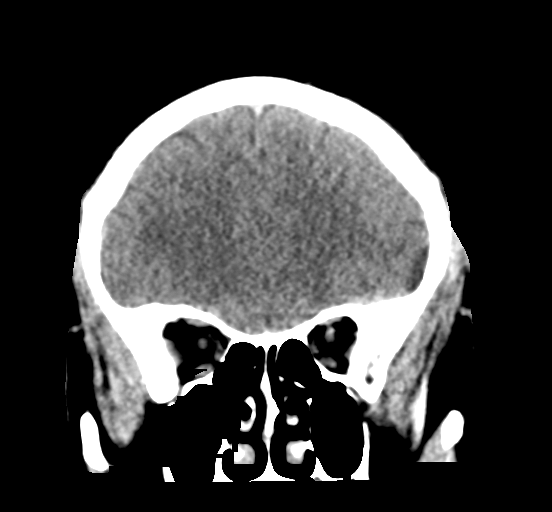

[Series 6: head without sag · sagittal · non-contrast · 0.30mm/px · 2 of 63 slices shown]
[im 21/63  brain]
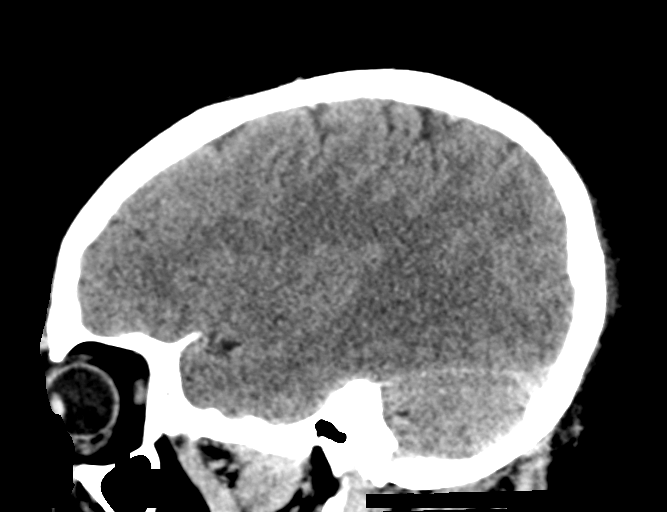
[im 42/63  brain]
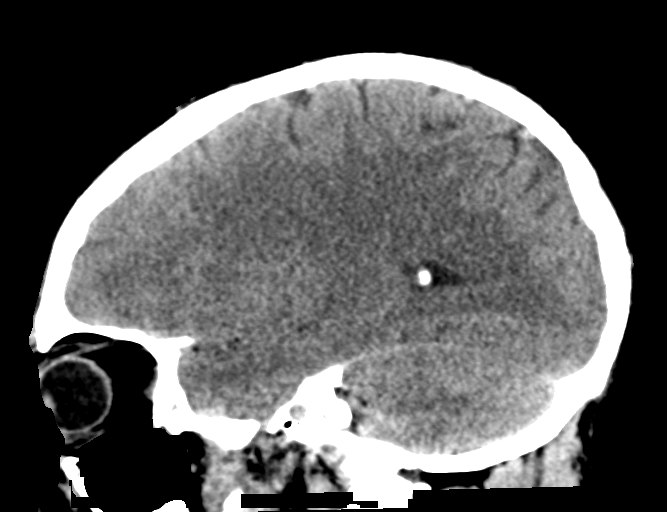

[Series 7: c_spine 2.0 st · axial · 0.25mm/px · z∈[+844,+884]mm · 3 of 89 slices shown]
[im 10/89  brain]
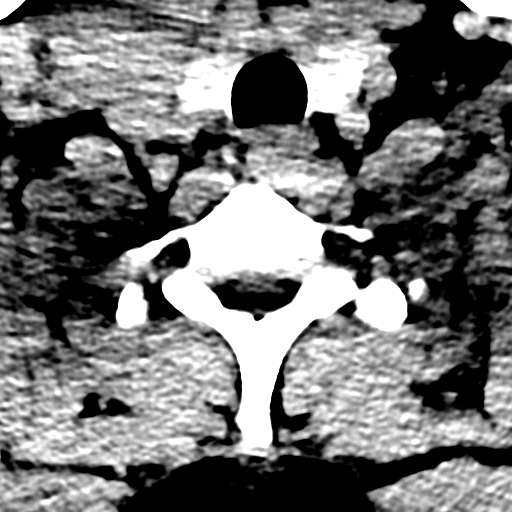
[im 20/89  brain]
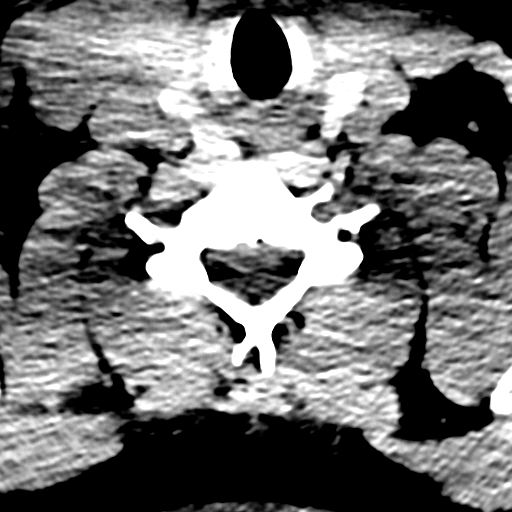
[im 30/89  brain]
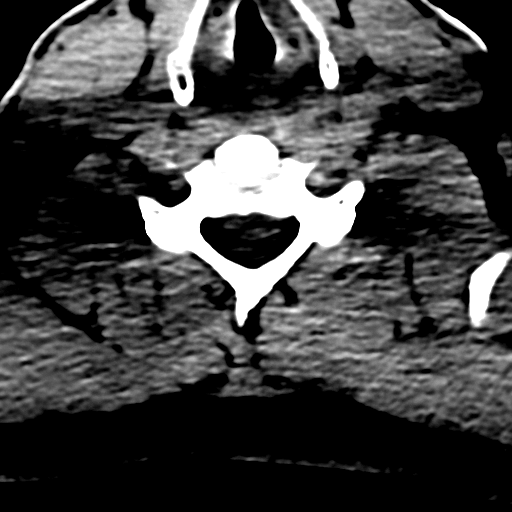

[17 of 47 positions shown; findings below may reference images not displayed]

FINDINGS: CT HEAD FINDINGS

Brain: The ventricles are normal in size and configuration. There is
no intracranial mass hemorrhage, extra-axial fluid collection, or
midline shift. Gray-white compartments are normal. There is no
evident acute infarct.

Vascular: No hyperdense vessel. Middle cerebral arteries show
equivalent attenuation bilaterally. There are no foci of arterial
vascular calcification.

Skull: Bony calvarium appears intact.

Sinuses/Orbits: There is mucosal thickening in ethmoid air cells
bilaterally anteriorly, full left normal height. There is mild
mucosal thickening in the posterior right maxillary antrum. Other
paranasal sinuses clear. Orbits appear symmetric bilaterally.

Other: Mastoid air cells are clear.

CT CERVICAL SPINE FINDINGS

Alignment: There is no spondylolisthesis.

Skull base and vertebrae: Skull base and craniocervical junction
regions appear normal. There is no evident fracture. No blastic or
lytic bone lesions.

Soft tissues and spinal canal: Prevertebral soft tissues and
predental space regions are normal. No spinal stenosis. No
paraspinous lesions evident.

Disc levels: There is slight disc space narrowing at C6-7. Other
disc spaces appear normal. No nerve root edema or effacement. No
disc extrusion or stenosis.

Upper chest: No abnormality noted.

Other: None
IMPRESSION: CT head: No intracranial mass, hemorrhage, or extra-axial fluid
collection. Gray-white compartments appear normal. No fractures
evident. There are areas of paranasal sinus disease.

CT cervical spine: No fracture or spondylolisthesis. Slight disc
space narrowing C6-7. No prevertebral effaced. Disc extrusion or
stenosis.

## 2018-04-24 IMAGING — CT CT CHEST LIMITED W/O CM
1 series · 15 of 31 positions shown, 19 images · non-contrast
Comparison: None.

CLINICAL DATA: Right clavicular fracture status post fall
07/17/2016

EXAM:
CT CHEST WITHOUT CONTRAST
TECHNIQUE: Multidetector CT imaging of the chest was performed following the
standard protocol without IV contrast.

[Series 3: clavicle-soft · axial · 0.98mm/px · z∈[-130,-10]mm · 15 of 44 slices shown, 19 images]
[im 2/44  mediastinal]
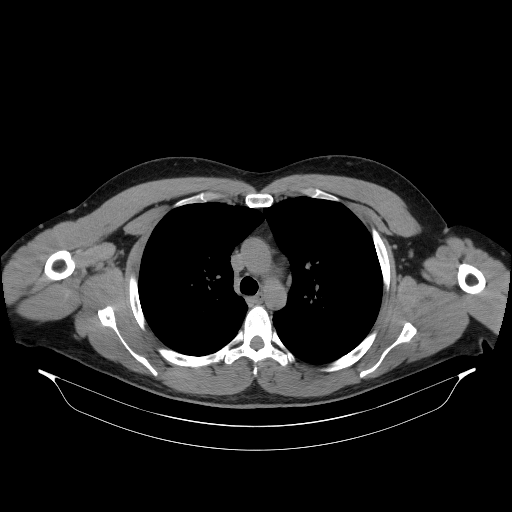
[im 2/44  lung]
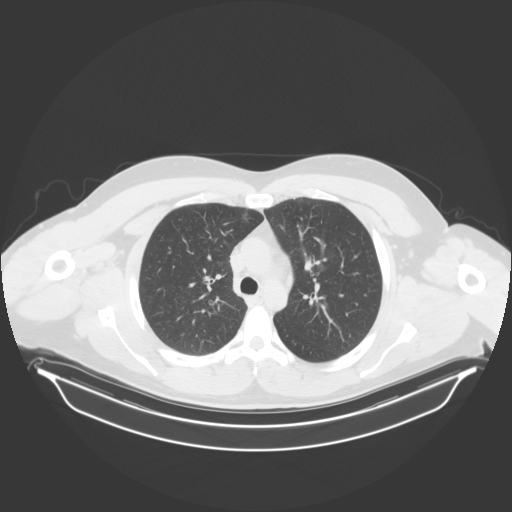
[im 5/44  lung]
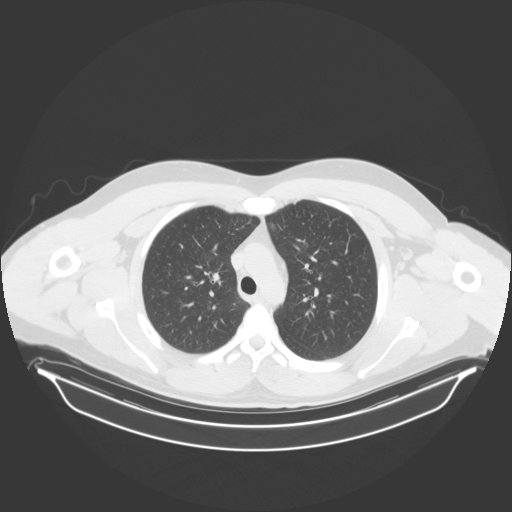
[im 8/44  lung]
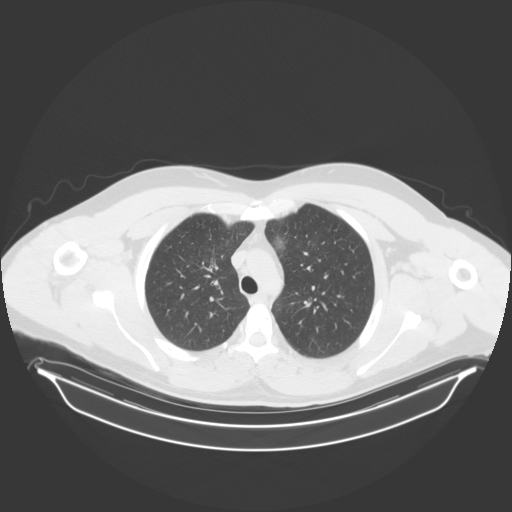
[im 10/44  lung]
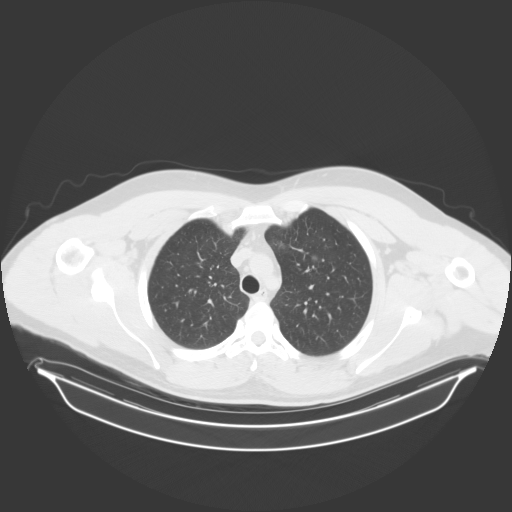
[im 13/44  mediastinal]
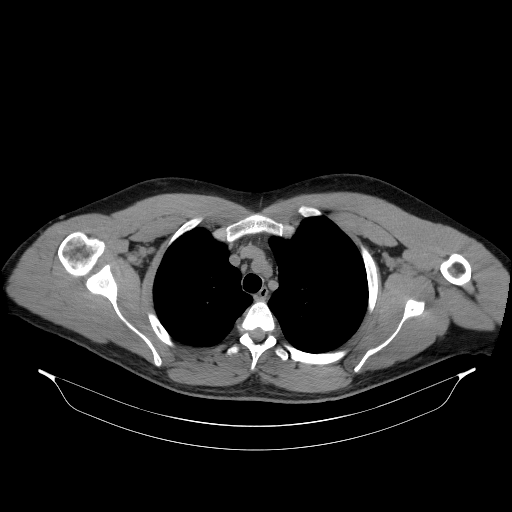
[im 13/44  lung]
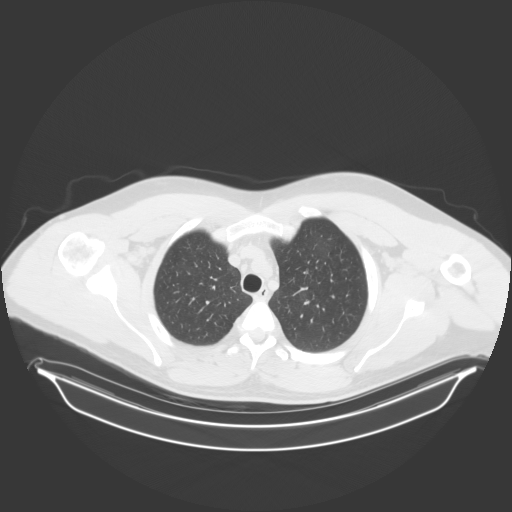
[im 16/44  lung]
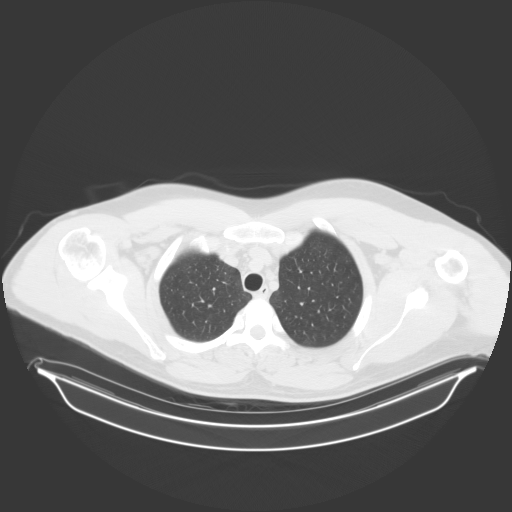
[im 20/44  lung]
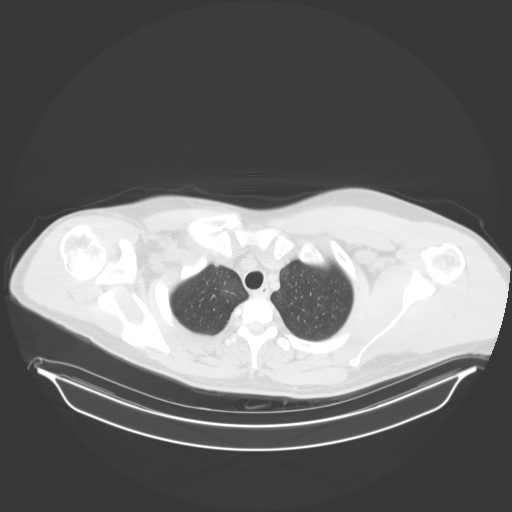
[im 23/44  lung]
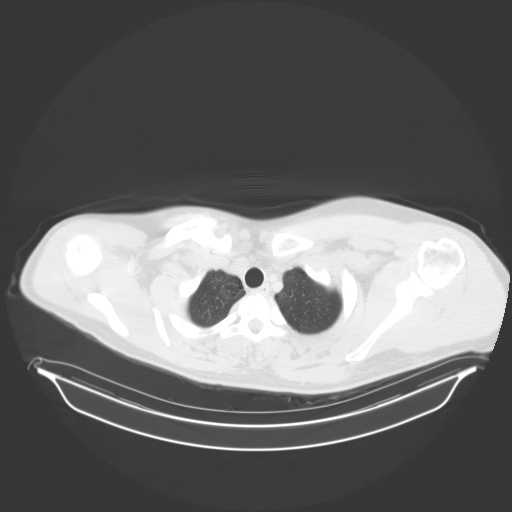
[im 25/44  mediastinal]
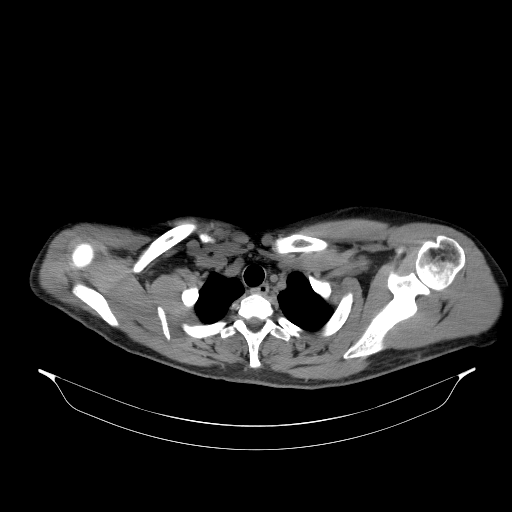
[im 25/44  lung]
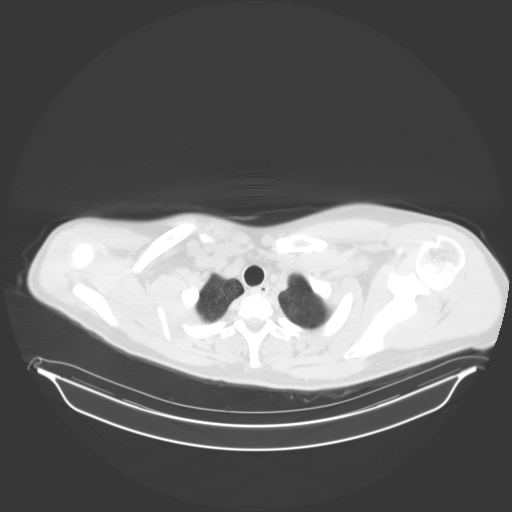
[im 28/44  lung]
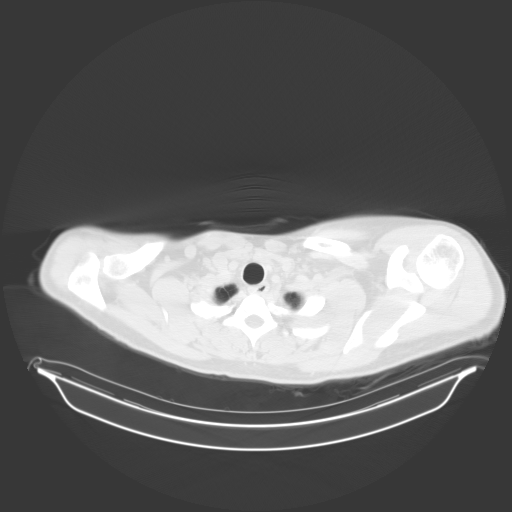
[im 31/44  lung]
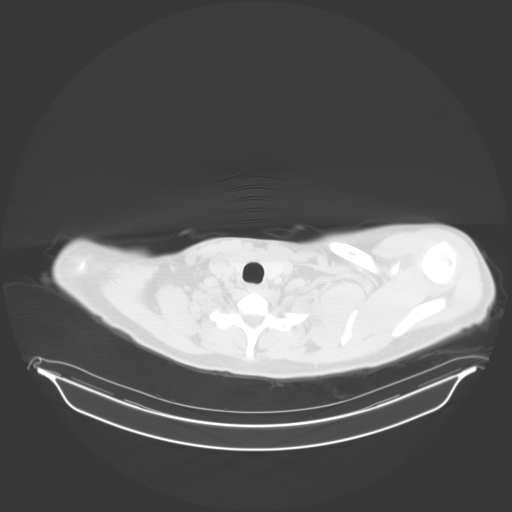
[im 34/44  lung]
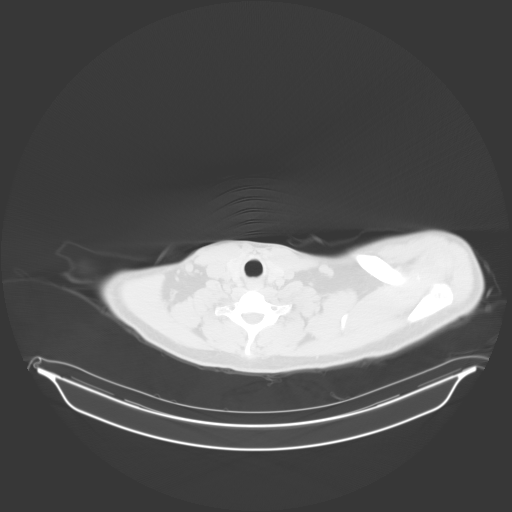
[im 36/44  mediastinal]
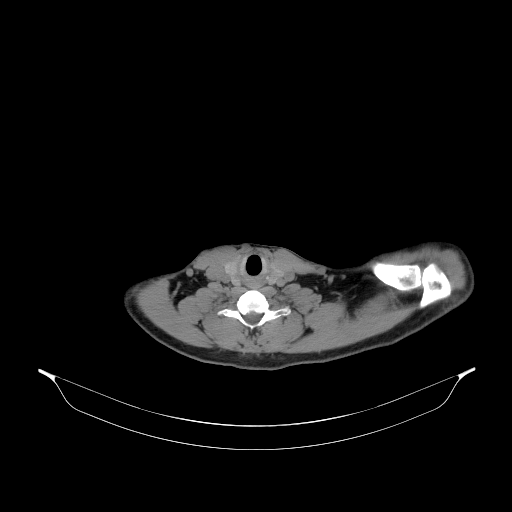
[im 36/44  lung]
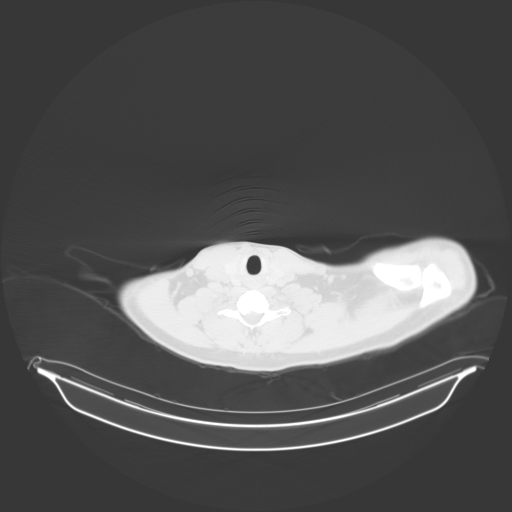
[im 39/44  lung]
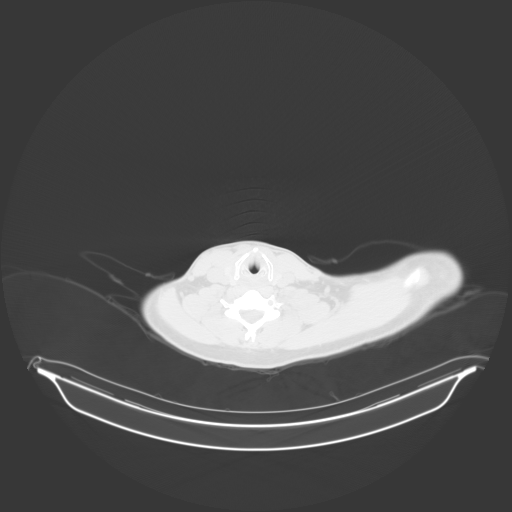
[im 42/44  lung]
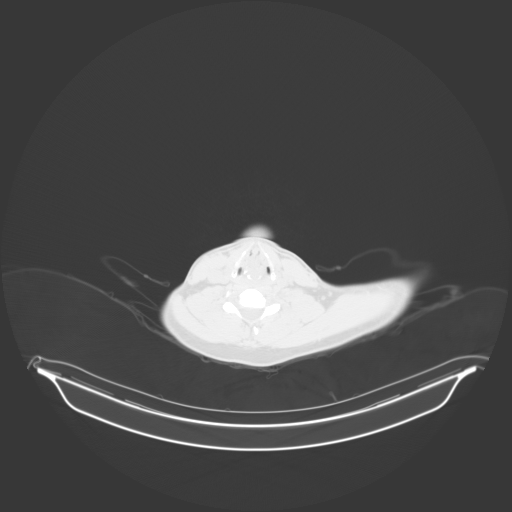

[15 of 31 positions shown; findings below may reference images not displayed]

FINDINGS: Bones/Joint/Cartilage

No acute fracture or dislocation. Normal alignment. No joint
effusion.

Malunited fracture of the right proximal clavicle with 2 cm of
overriding between the fracture fragments and 10 mm of anterior
displacement with osseous bridging along the posteriorly.

Ligaments

Ligaments are suboptimally evaluated by CT.

Muscles and Tendons
Muscles are normal.

Soft tissue
No fluid collection or hematoma. No soft tissue mass. Multiple
scattered nodular ground-glass opacities bilaterally which may be
secondary to an infectious or inflammatory etiology.
IMPRESSION: 1. Malunited fracture of the right proximal clavicle with 2 cm of
overriding between the fracture fragments and 10 mm of anterior
displacement with osseous bridging along the posteriorly.
2. Multiple scattered nodular ground-glass opacities bilaterally
which may be secondary to an infectious or inflammatory etiology.

## 2018-05-19 IMAGING — RF DG C-ARM 61-120 MIN
1 series · 4 of 4 positions shown · non-contrast
Comparison: Chest CT 01/05/2017

CLINICAL DATA: Right medial clavicle fracture

EXAM:
DG C-ARM 61-120 MIN; STENOCLAVICULAR JOINTS - 3+ VIEW

[Series 1: run · 4 of 4 slices shown]
[im 1/4]
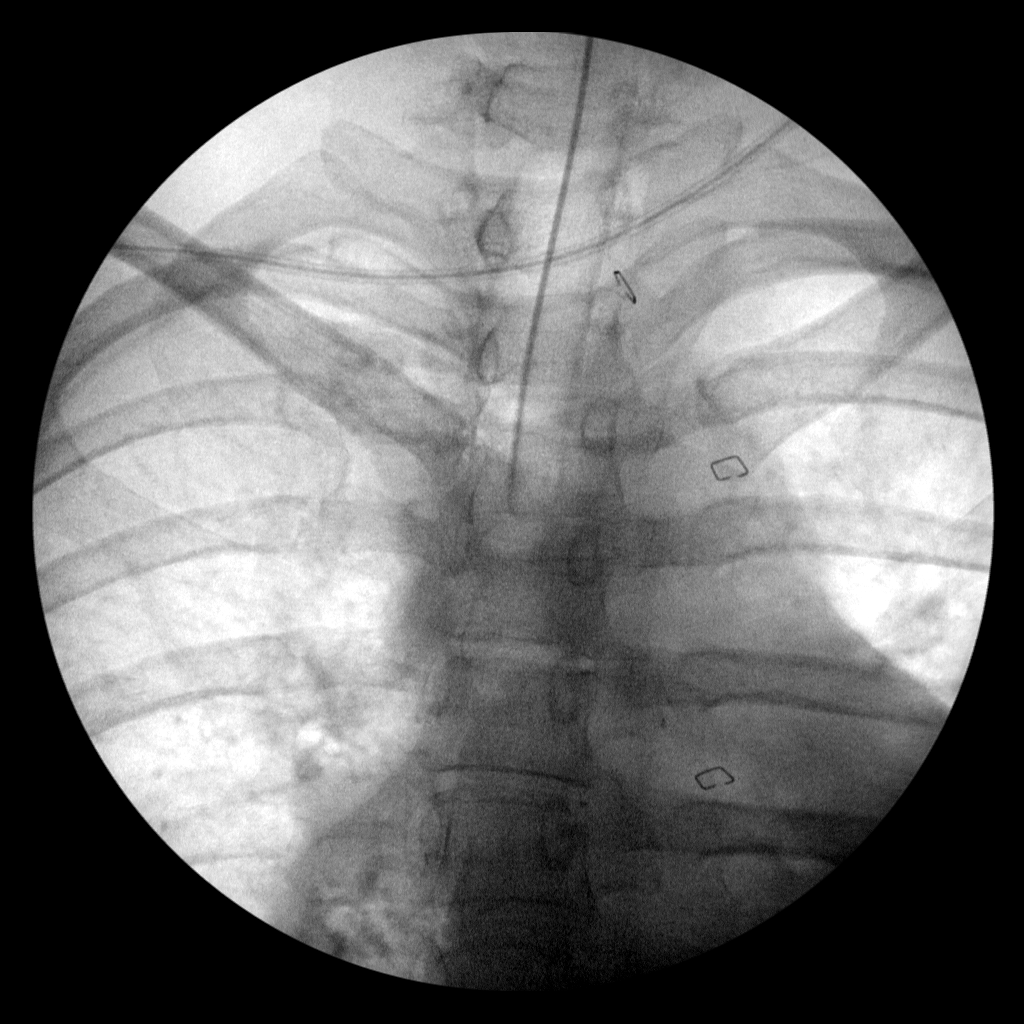
[im 2/4]
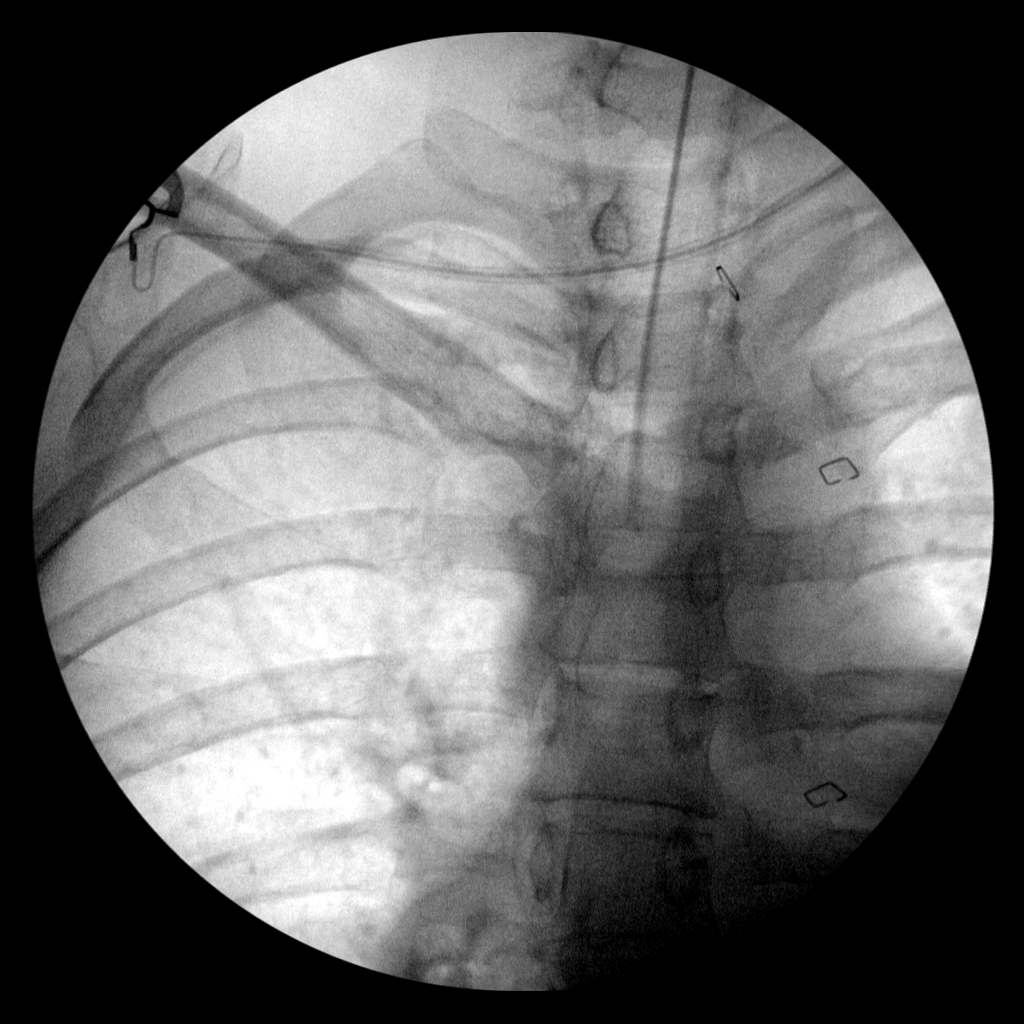
[im 3/4]
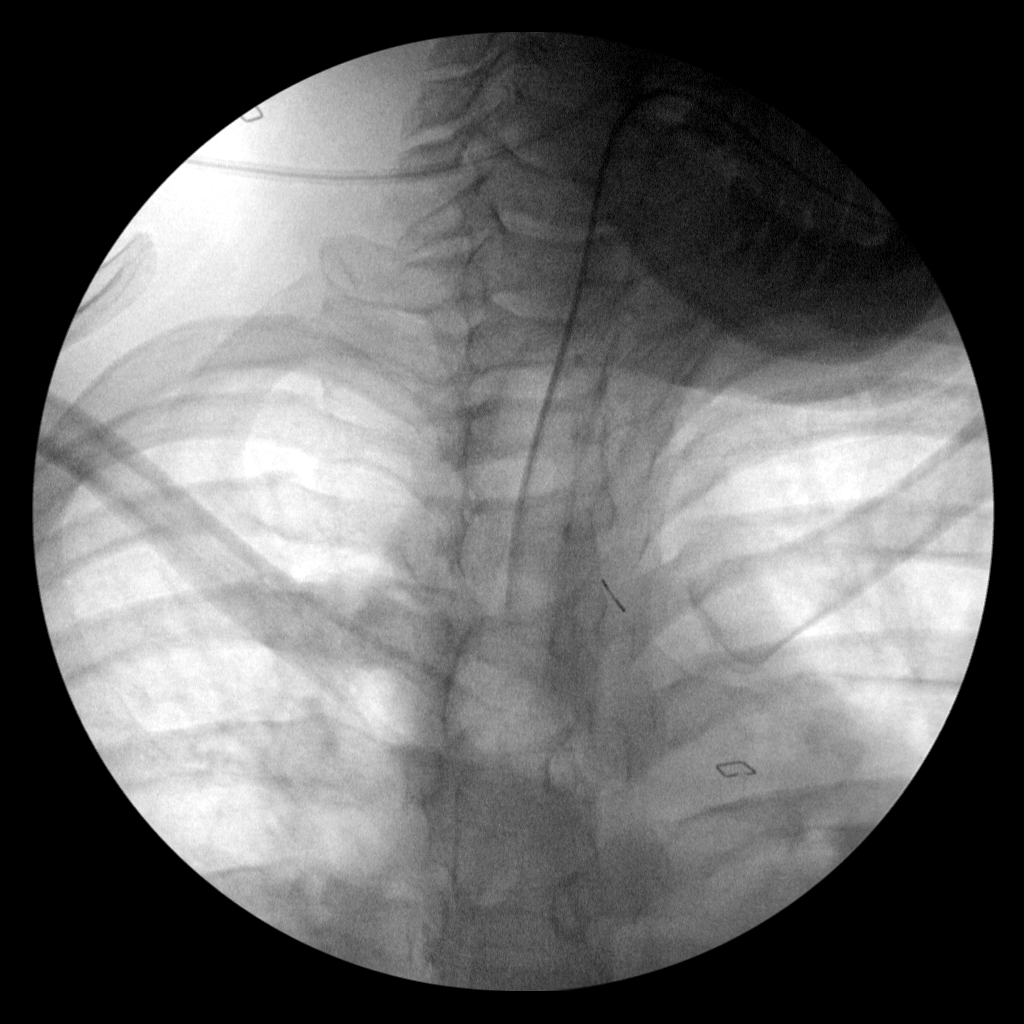
[im 4/4]
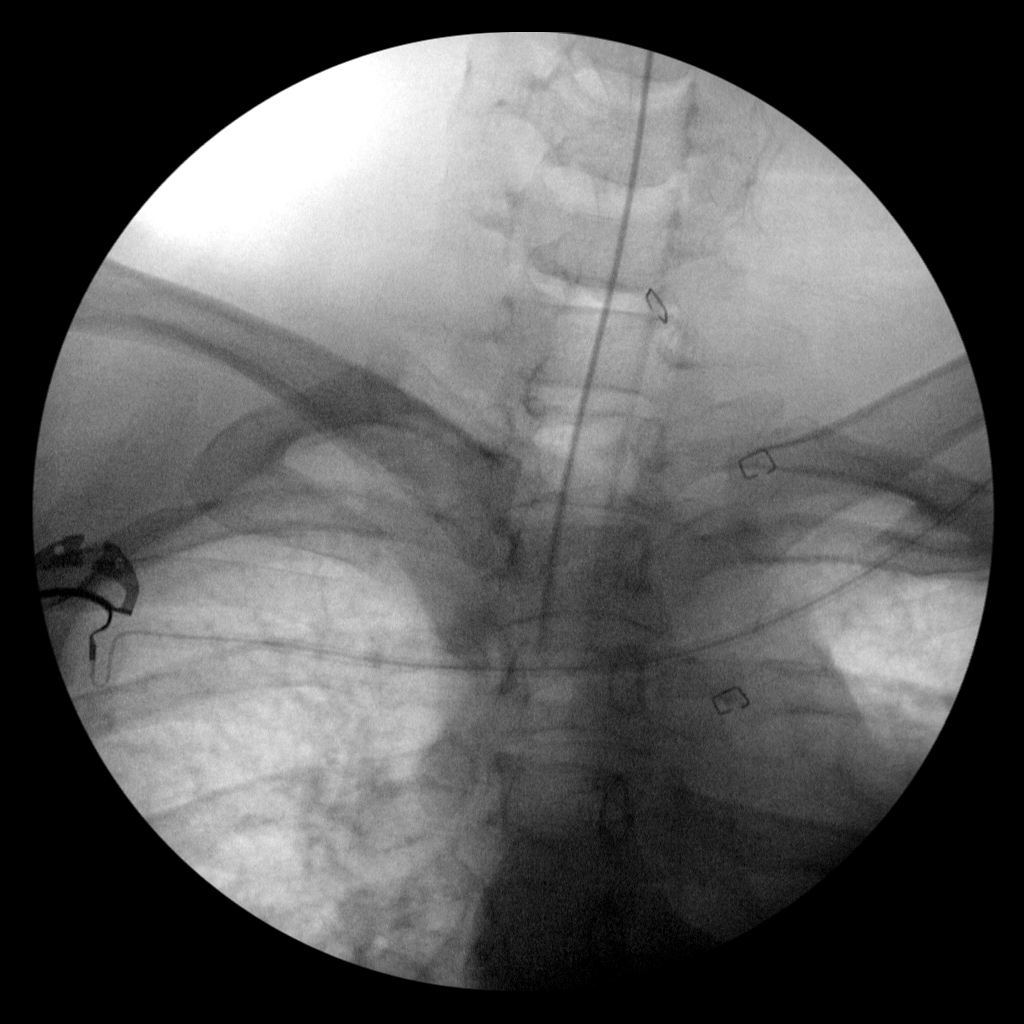

[4 of 4 positions shown; findings below may reference images not displayed]

FINDINGS: Multiple intraoperative spot images submitted over the medial right
clavicle. Final images demonstrate this what appears to be anatomic
alignment of the right clavicle across the medial fracture line.
This would be better evaluated with CT.
IMPRESSION: Apparent near anatomic alignment across the medial right clavicle
fracture on these intraoperative spot images.

## 2018-05-19 IMAGING — DX DG CLAVICLE*R*
1 series · 1 of 1 positions shown · non-contrast
Comparison: 07/17/2016

CLINICAL DATA: Postop

EXAM:
RIGHT CLAVICLE - 2+ VIEWS

[clavicle ap]
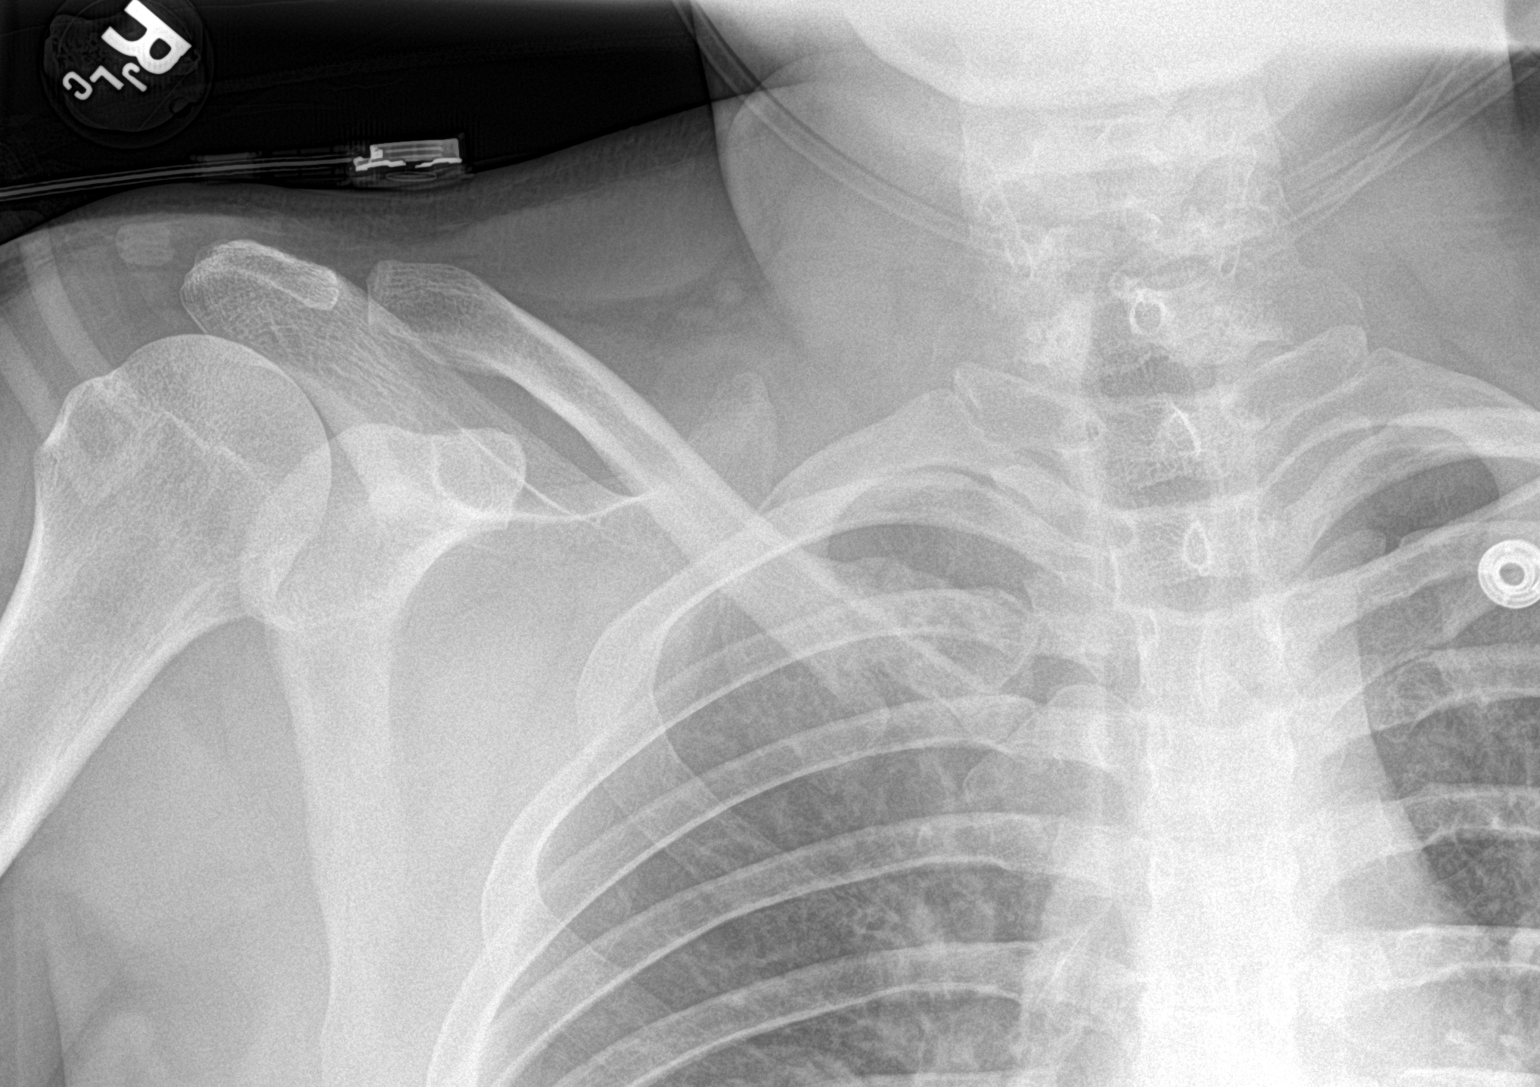

[1 of 1 positions shown; findings below may reference images not displayed]

FINDINGS: Deformity of the medial right clavicle noted, not visualized on
prior study. This may be related to postop changes or old healing
fracture. AC joint and glenohumeral joint appear intact.
IMPRESSION: Deformity of the medial right clavicle which may be postoperative or
related to old healing fracture.

## 2020-01-19 ENCOUNTER — Ambulatory Visit: Payer: Self-pay | Admitting: Cardiology

## 2020-05-10 ENCOUNTER — Ambulatory Visit: Payer: Self-pay | Admitting: Cardiology

## 2020-07-08 ENCOUNTER — Encounter: Payer: Self-pay | Admitting: General Practice

## 2024-04-14 ENCOUNTER — Other Ambulatory Visit: Payer: Self-pay

## 2024-04-14 ENCOUNTER — Emergency Department (HOSPITAL_COMMUNITY): Payer: Self-pay

## 2024-04-14 ENCOUNTER — Encounter (HOSPITAL_COMMUNITY): Payer: Self-pay

## 2024-04-14 ENCOUNTER — Emergency Department (HOSPITAL_COMMUNITY)
Admission: EM | Admit: 2024-04-14 | Discharge: 2024-04-14 | Disposition: A | Discharge status: Home / Self Care | Payer: Self-pay | Attending: Emergency Medicine | Admitting: Emergency Medicine

## 2024-04-14 DIAGNOSIS — Z79899 Other long term (current) drug therapy: Secondary | ICD-10-CM | POA: Insufficient documentation

## 2024-04-14 DIAGNOSIS — R451 Restlessness and agitation: Secondary | ICD-10-CM | POA: Insufficient documentation

## 2024-04-14 DIAGNOSIS — F419 Anxiety disorder, unspecified: Secondary | ICD-10-CM | POA: Insufficient documentation

## 2024-04-14 DIAGNOSIS — E869 Volume depletion, unspecified: Secondary | ICD-10-CM | POA: Insufficient documentation

## 2024-04-14 DIAGNOSIS — R000 Tachycardia, unspecified: Secondary | ICD-10-CM

## 2024-04-14 LAB — I-STAT ARTERIAL BLOOD GAS, ED
Acid-base deficit: 1 mmol/L (ref 0.0–2.0)
Bicarbonate: 22.7 mmol/L (ref 20.0–28.0)
Calcium, Ion: 1.26 mmol/L (ref 1.15–1.40)
HCT: 48 % (ref 39.0–52.0)
Hemoglobin: 16.3 g/dL (ref 13.0–17.0)
O2 Saturation: 98 %
Patient temperature: 97.6
Potassium: 2.8 mmol/L — ABNORMAL LOW (ref 3.5–5.1)
Sodium: 142 mmol/L (ref 135–145)
TCO2: 24 mmol/L (ref 22–32)
pCO2 arterial: 33.3 mmHg (ref 32–48)
pH, Arterial: 7.438 (ref 7.35–7.45)
pO2, Arterial: 99 mmHg (ref 83–108)

## 2024-04-14 LAB — COMPREHENSIVE METABOLIC PANEL WITH GFR
ALT: 32 U/L (ref 0–44)
AST: 31 U/L (ref 15–41)
Albumin: 4.5 g/dL (ref 3.5–5.0)
Alkaline Phosphatase: 37 U/L — ABNORMAL LOW (ref 38–126)
Anion gap: 18 — ABNORMAL HIGH (ref 5–15)
BUN: 20 mg/dL (ref 6–20)
CO2: 17 mmol/L — ABNORMAL LOW (ref 22–32)
Calcium: 10.1 mg/dL (ref 8.9–10.3)
Chloride: 107 mmol/L (ref 98–111)
Creatinine, Ser: 1.4 mg/dL — ABNORMAL HIGH (ref 0.61–1.24)
GFR, Estimated: >60 mL/min (ref >60)
Glucose, Bld: 131 mg/dL — ABNORMAL HIGH (ref 70–99)
Potassium: 3.3 mmol/L — ABNORMAL LOW (ref 3.5–5.1)
Sodium: 142 mmol/L (ref 135–145)
Total Bilirubin: 1.7 mg/dL — ABNORMAL HIGH (ref 0.0–1.2)
Total Protein: 7.8 g/dL (ref 6.5–8.1)

## 2024-04-14 LAB — RAPID URINE DRUG SCREEN, HOSP PERFORMED
Amphetamines: POSITIVE — AB
Barbiturates: NOT DETECTED
Benzodiazepines: NOT DETECTED
Cocaine: NOT DETECTED
Opiates: NOT DETECTED
Tetrahydrocannabinol: POSITIVE — AB

## 2024-04-14 LAB — CBC
HCT: 36.8 % — ABNORMAL LOW (ref 39.0–52.0)
Hemoglobin: 12.7 g/dL — ABNORMAL LOW (ref 13.0–17.0)
MCH: 30 pg (ref 26.0–34.0)
MCHC: 34.5 g/dL (ref 30.0–36.0)
MCV: 86.8 fL (ref 80.0–100.0)
Platelets: 310 K/uL (ref 150–400)
RBC: 4.24 MIL/uL (ref 4.22–5.81)
RDW: 13.4 % (ref 11.5–15.5)
WBC: 7.4 K/uL (ref 4.0–10.5)
nRBC: 0 % (ref 0.0–0.2)

## 2024-04-14 LAB — TROPONIN I (HIGH SENSITIVITY): Troponin I (High Sensitivity): 4 ng/L (ref <18)

## 2024-04-14 LAB — ETHANOL: Alcohol, Ethyl (B): <15 mg/dL (ref <15)

## 2024-04-14 LAB — CBG MONITORING, ED: Glucose-Capillary: 135 mg/dL — ABNORMAL HIGH (ref 70–99)

## 2024-04-14 MED ORDER — NALOXONE HCL 0.4 MG/ML IJ SOLN
0.4000 mg | Freq: Once | INTRAMUSCULAR | Status: AC
  Administered 2024-04-14: 0.4 mg via INTRAVENOUS
  Filled 2024-04-14: qty 1

## 2024-04-14 NOTE — ED Notes (Signed)
 Pt had multiple apneic episodes RT at bedside and initiated Vmbm, asked Dr Levander for narcan .

## 2024-04-14 NOTE — Progress Notes (Signed)
 Pt had multiple apneic spells with no respirations, pinpoint pupils, and no reaction to painful stimuli. Pt currently being monitored with ETCO2.
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
## 2024-04-14 NOTE — Discharge Instructions (Addendum)
 Please drink plenty of fluids. Follow-up with your doctor as soon as possible Return if you are having any new or worsening symptoms
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
